# Patient Record
Sex: Female | Born: 1950 | Race: Black or African American | Hispanic: No | Smoking: Never smoker
Health system: Southern US, Community
[De-identification: ages and names within clinical notes are randomized; demographics above are authoritative.]

## PROBLEM LIST (undated history)

## (undated) DIAGNOSIS — F32A Depression, unspecified: Secondary | ICD-10-CM

## (undated) DIAGNOSIS — E119 Type 2 diabetes mellitus without complications: Secondary | ICD-10-CM

## (undated) DIAGNOSIS — F329 Major depressive disorder, single episode, unspecified: Secondary | ICD-10-CM

## (undated) DIAGNOSIS — G4730 Sleep apnea, unspecified: Secondary | ICD-10-CM

## (undated) DIAGNOSIS — K219 Gastro-esophageal reflux disease without esophagitis: Secondary | ICD-10-CM

## (undated) DIAGNOSIS — M549 Dorsalgia, unspecified: Secondary | ICD-10-CM

## (undated) DIAGNOSIS — Z8639 Personal history of other endocrine, nutritional and metabolic disease: Secondary | ICD-10-CM

## (undated) DIAGNOSIS — I10 Essential (primary) hypertension: Secondary | ICD-10-CM

## (undated) DIAGNOSIS — M542 Cervicalgia: Secondary | ICD-10-CM

## (undated) DIAGNOSIS — M1990 Unspecified osteoarthritis, unspecified site: Secondary | ICD-10-CM

## (undated) HISTORY — PX: HERNIA REPAIR: SHX51

## (undated) HISTORY — PX: BREAST CYST ASPIRATION: SHX578

## (undated) HISTORY — PX: TONSILLECTOMY: SUR1361

## (undated) HISTORY — PX: ABDOMINAL HYSTERECTOMY: SHX81

## (undated) HISTORY — PX: CHOLECYSTECTOMY: SHX55

## (undated) HISTORY — PX: LAPAROSCOPIC GASTRIC BANDING: SHX1100

---

## 1996-10-27 HISTORY — PX: ANTERIOR FUSION CERVICAL SPINE: SUR626

## 2004-07-22 ENCOUNTER — Ambulatory Visit (HOSPITAL_BASED_OUTPATIENT_CLINIC_OR_DEPARTMENT_OTHER): Admission: RE | Admit: 2004-07-22 | Discharge: 2004-07-22 | Payer: Self-pay | Admitting: Family Medicine

## 2004-08-05 ENCOUNTER — Encounter: Admission: RE | Admit: 2004-08-05 | Discharge: 2004-08-15 | Payer: Self-pay | Admitting: Family Medicine

## 2004-09-09 ENCOUNTER — Encounter (INDEPENDENT_AMBULATORY_CARE_PROVIDER_SITE_OTHER): Payer: Self-pay | Admitting: *Deleted

## 2004-09-09 ENCOUNTER — Ambulatory Visit (HOSPITAL_COMMUNITY): Admission: RE | Admit: 2004-09-09 | Discharge: 2004-09-09 | Payer: Self-pay | Admitting: Gastroenterology

## 2004-11-21 ENCOUNTER — Encounter: Admission: RE | Admit: 2004-11-21 | Discharge: 2005-02-19 | Payer: Self-pay | Admitting: Family Medicine

## 2004-11-27 ENCOUNTER — Ambulatory Visit: Payer: Self-pay | Admitting: Internal Medicine

## 2005-01-21 ENCOUNTER — Encounter: Admission: RE | Admit: 2005-01-21 | Discharge: 2005-01-21 | Payer: Self-pay | Admitting: Obstetrics and Gynecology

## 2005-05-09 ENCOUNTER — Encounter: Admission: RE | Admit: 2005-05-09 | Discharge: 2005-05-09 | Payer: Self-pay | Admitting: Family Medicine

## 2005-05-09 ENCOUNTER — Emergency Department (HOSPITAL_COMMUNITY): Admission: EM | Admit: 2005-05-09 | Discharge: 2005-05-09 | Payer: Self-pay | Admitting: Emergency Medicine

## 2005-05-19 ENCOUNTER — Encounter: Admission: RE | Admit: 2005-05-19 | Discharge: 2005-05-19 | Payer: Self-pay | Admitting: Family Medicine

## 2005-05-21 ENCOUNTER — Emergency Department (HOSPITAL_COMMUNITY): Admission: EM | Admit: 2005-05-21 | Discharge: 2005-05-21 | Payer: Self-pay | Admitting: Emergency Medicine

## 2005-06-24 ENCOUNTER — Encounter: Admission: RE | Admit: 2005-06-24 | Discharge: 2005-06-24 | Payer: Self-pay | Admitting: Family Medicine

## 2005-07-07 ENCOUNTER — Encounter: Admission: RE | Admit: 2005-07-07 | Discharge: 2005-07-07 | Payer: Self-pay | Admitting: Endocrinology

## 2005-08-06 ENCOUNTER — Ambulatory Visit (HOSPITAL_COMMUNITY): Admission: RE | Admit: 2005-08-06 | Discharge: 2005-08-06 | Payer: Self-pay | Admitting: Neurology

## 2005-09-12 ENCOUNTER — Ambulatory Visit (HOSPITAL_COMMUNITY): Admission: RE | Admit: 2005-09-12 | Discharge: 2005-09-12 | Payer: Self-pay | Admitting: Pulmonary Disease

## 2005-09-12 ENCOUNTER — Encounter (INDEPENDENT_AMBULATORY_CARE_PROVIDER_SITE_OTHER): Payer: Self-pay | Admitting: Specialist

## 2005-10-14 ENCOUNTER — Encounter: Admission: RE | Admit: 2005-10-14 | Discharge: 2005-10-14 | Payer: Self-pay | Admitting: Family Medicine

## 2005-10-22 ENCOUNTER — Ambulatory Visit (HOSPITAL_COMMUNITY): Admission: RE | Admit: 2005-10-22 | Discharge: 2005-10-22 | Payer: Self-pay | Admitting: Neurology

## 2006-01-08 ENCOUNTER — Encounter: Admission: RE | Admit: 2006-01-08 | Discharge: 2006-01-08 | Payer: Self-pay | Admitting: Neurological Surgery

## 2006-01-22 ENCOUNTER — Ambulatory Visit (HOSPITAL_COMMUNITY): Admission: RE | Admit: 2006-01-22 | Discharge: 2006-01-22 | Payer: Self-pay | Admitting: Obstetrics and Gynecology

## 2006-01-28 ENCOUNTER — Encounter: Admission: RE | Admit: 2006-01-28 | Discharge: 2006-04-15 | Payer: Self-pay | Admitting: Surgery

## 2006-01-30 ENCOUNTER — Encounter: Admission: RE | Admit: 2006-01-30 | Discharge: 2006-01-30 | Payer: Self-pay | Admitting: Family Medicine

## 2006-02-11 ENCOUNTER — Encounter: Admission: RE | Admit: 2006-02-11 | Discharge: 2006-02-11 | Payer: Self-pay | Admitting: Family Medicine

## 2006-02-13 ENCOUNTER — Ambulatory Visit (HOSPITAL_COMMUNITY): Admission: RE | Admit: 2006-02-13 | Discharge: 2006-02-13 | Payer: Self-pay | Admitting: Surgery

## 2006-04-27 ENCOUNTER — Encounter: Admission: RE | Admit: 2006-04-27 | Discharge: 2006-04-27 | Payer: Self-pay | Admitting: Neurological Surgery

## 2006-05-15 ENCOUNTER — Encounter: Admission: RE | Admit: 2006-05-15 | Discharge: 2006-07-15 | Payer: Self-pay | Admitting: Surgery

## 2006-07-30 ENCOUNTER — Encounter: Admission: RE | Admit: 2006-07-30 | Discharge: 2006-07-30 | Payer: Self-pay | Admitting: Family Medicine

## 2006-08-03 ENCOUNTER — Encounter: Admission: RE | Admit: 2006-08-03 | Discharge: 2006-11-01 | Payer: Self-pay | Admitting: Surgery

## 2006-08-17 ENCOUNTER — Ambulatory Visit (HOSPITAL_COMMUNITY): Admission: RE | Admit: 2006-08-17 | Discharge: 2006-08-18 | Payer: Self-pay | Admitting: Surgery

## 2006-10-27 DIAGNOSIS — G4730 Sleep apnea, unspecified: Secondary | ICD-10-CM

## 2006-10-27 HISTORY — DX: Sleep apnea, unspecified: G47.30

## 2006-11-30 ENCOUNTER — Encounter: Admission: RE | Admit: 2006-11-30 | Discharge: 2006-11-30 | Payer: Self-pay | Admitting: Neurological Surgery

## 2006-12-01 ENCOUNTER — Encounter: Admission: RE | Admit: 2006-12-01 | Discharge: 2007-03-01 | Payer: Self-pay | Admitting: Surgery

## 2007-02-02 ENCOUNTER — Encounter: Admission: RE | Admit: 2007-02-02 | Discharge: 2007-02-02 | Payer: Self-pay | Admitting: Family Medicine

## 2007-03-04 ENCOUNTER — Encounter: Admission: RE | Admit: 2007-03-04 | Discharge: 2007-06-02 | Payer: Self-pay | Admitting: Surgery

## 2007-06-03 ENCOUNTER — Encounter: Admission: RE | Admit: 2007-06-03 | Discharge: 2007-07-27 | Payer: Self-pay | Admitting: Surgery

## 2007-07-28 ENCOUNTER — Encounter: Admission: RE | Admit: 2007-07-28 | Discharge: 2007-07-28 | Payer: Self-pay | Admitting: Family Medicine

## 2007-08-19 ENCOUNTER — Encounter: Admission: RE | Admit: 2007-08-19 | Discharge: 2007-08-19 | Payer: Self-pay | Admitting: Neurological Surgery

## 2007-08-24 ENCOUNTER — Encounter: Admission: RE | Admit: 2007-08-24 | Discharge: 2007-08-24 | Payer: Self-pay | Admitting: Surgery

## 2007-09-01 ENCOUNTER — Inpatient Hospital Stay (HOSPITAL_COMMUNITY): Admission: EM | Admit: 2007-09-01 | Discharge: 2007-09-03 | Payer: Self-pay | Admitting: Emergency Medicine

## 2007-10-15 ENCOUNTER — Ambulatory Visit (HOSPITAL_COMMUNITY): Admission: RE | Admit: 2007-10-15 | Discharge: 2007-10-15 | Payer: Self-pay | Admitting: General Surgery

## 2007-10-26 ENCOUNTER — Ambulatory Visit (HOSPITAL_BASED_OUTPATIENT_CLINIC_OR_DEPARTMENT_OTHER): Admission: RE | Admit: 2007-10-26 | Discharge: 2007-10-26 | Payer: Self-pay | Admitting: Cardiology

## 2007-10-29 ENCOUNTER — Emergency Department (HOSPITAL_COMMUNITY): Admission: EM | Admit: 2007-10-29 | Discharge: 2007-10-29 | Payer: Self-pay | Admitting: Emergency Medicine

## 2007-10-30 ENCOUNTER — Ambulatory Visit: Payer: Self-pay | Admitting: Internal Medicine

## 2008-01-31 ENCOUNTER — Emergency Department (HOSPITAL_COMMUNITY): Admission: EM | Admit: 2008-01-31 | Discharge: 2008-01-31 | Payer: Self-pay | Admitting: Emergency Medicine

## 2008-02-03 ENCOUNTER — Encounter: Admission: RE | Admit: 2008-02-03 | Discharge: 2008-02-03 | Payer: Self-pay | Admitting: Family Medicine

## 2008-07-26 ENCOUNTER — Emergency Department (HOSPITAL_BASED_OUTPATIENT_CLINIC_OR_DEPARTMENT_OTHER): Admission: EM | Admit: 2008-07-26 | Discharge: 2008-07-26 | Payer: Self-pay | Admitting: Emergency Medicine

## 2008-08-09 ENCOUNTER — Ambulatory Visit (HOSPITAL_COMMUNITY): Admission: RE | Admit: 2008-08-09 | Discharge: 2008-08-09 | Payer: Self-pay | Admitting: Family Medicine

## 2008-09-13 ENCOUNTER — Ambulatory Visit: Payer: Self-pay | Admitting: Sports Medicine

## 2008-09-13 DIAGNOSIS — M84376A Stress fracture, unspecified foot, initial encounter for fracture: Secondary | ICD-10-CM | POA: Insufficient documentation

## 2008-10-05 ENCOUNTER — Ambulatory Visit: Payer: Self-pay | Admitting: Sports Medicine

## 2008-10-05 DIAGNOSIS — M7750 Other enthesopathy of unspecified foot: Secondary | ICD-10-CM | POA: Insufficient documentation

## 2008-11-03 ENCOUNTER — Ambulatory Visit (HOSPITAL_COMMUNITY): Admission: RE | Admit: 2008-11-03 | Discharge: 2008-11-03 | Payer: Self-pay | Admitting: General Surgery

## 2008-11-05 ENCOUNTER — Ambulatory Visit (HOSPITAL_COMMUNITY): Admission: RE | Admit: 2008-11-05 | Discharge: 2008-11-05 | Payer: Self-pay | Admitting: Neurological Surgery

## 2008-11-15 ENCOUNTER — Ambulatory Visit: Payer: Self-pay | Admitting: Sports Medicine

## 2009-02-05 ENCOUNTER — Encounter: Admission: RE | Admit: 2009-02-05 | Discharge: 2009-02-05 | Payer: Self-pay | Admitting: Obstetrics and Gynecology

## 2009-05-23 ENCOUNTER — Ambulatory Visit (HOSPITAL_COMMUNITY): Admission: RE | Admit: 2009-05-23 | Discharge: 2009-05-23 | Payer: Self-pay | Admitting: Neurological Surgery

## 2009-06-19 ENCOUNTER — Ambulatory Visit: Payer: Self-pay | Admitting: Oncology

## 2009-07-06 ENCOUNTER — Inpatient Hospital Stay (HOSPITAL_COMMUNITY): Admission: EM | Admit: 2009-07-06 | Discharge: 2009-07-09 | Payer: Self-pay | Admitting: Emergency Medicine

## 2009-07-06 ENCOUNTER — Ambulatory Visit: Payer: Self-pay | Admitting: Surgery

## 2009-07-06 ENCOUNTER — Encounter (INDEPENDENT_AMBULATORY_CARE_PROVIDER_SITE_OTHER): Payer: Self-pay | Admitting: Internal Medicine

## 2009-07-18 LAB — CBC WITH DIFFERENTIAL/PLATELET
Basophils Absolute: 0 10*3/uL (ref 0.0–0.1)
EOS%: 0.8 % (ref 0.0–7.0)
HGB: 12.7 g/dL (ref 11.6–15.9)
MCH: 28.4 pg (ref 25.1–34.0)
MCV: 83.4 fL (ref 79.5–101.0)
MONO%: 6.4 % (ref 0.0–14.0)
RBC: 4.46 10*6/uL (ref 3.70–5.45)
RDW: 14.8 % — ABNORMAL HIGH (ref 11.2–14.5)

## 2009-07-18 LAB — APTT: aPTT: 32 seconds (ref 24–37)

## 2009-07-19 LAB — IRON AND TIBC
%SAT: 24 % (ref 20–55)
TIBC: 278 ug/dL (ref 250–470)
UIBC: 212 ug/dL

## 2009-07-19 LAB — COMPREHENSIVE METABOLIC PANEL
AST: 13 U/L (ref 0–37)
Albumin: 3.9 g/dL (ref 3.5–5.2)
Alkaline Phosphatase: 57 U/L (ref 39–117)
BUN: 9 mg/dL (ref 6–23)
Potassium: 4.1 mEq/L (ref 3.5–5.3)
Total Bilirubin: 0.3 mg/dL (ref 0.3–1.2)

## 2009-07-19 LAB — FOLATE RBC: RBC Folate: 576 ng/mL (ref 180–600)

## 2009-07-19 LAB — VITAMIN B12: Vitamin B-12: 682 pg/mL (ref 211–911)

## 2009-10-04 ENCOUNTER — Ambulatory Visit (HOSPITAL_COMMUNITY): Admission: RE | Admit: 2009-10-04 | Discharge: 2009-10-04 | Payer: Self-pay | Admitting: Surgery

## 2009-10-18 ENCOUNTER — Ambulatory Visit (HOSPITAL_BASED_OUTPATIENT_CLINIC_OR_DEPARTMENT_OTHER): Admission: RE | Admit: 2009-10-18 | Discharge: 2009-10-18 | Payer: Self-pay | Admitting: Urology

## 2010-02-06 ENCOUNTER — Encounter: Admission: RE | Admit: 2010-02-06 | Discharge: 2010-02-06 | Payer: Self-pay | Admitting: Family Medicine

## 2010-03-18 ENCOUNTER — Ambulatory Visit: Payer: Self-pay | Admitting: Surgery

## 2010-11-17 ENCOUNTER — Encounter: Payer: Self-pay | Admitting: Family Medicine

## 2010-11-17 ENCOUNTER — Encounter: Payer: Self-pay | Admitting: Neurological Surgery

## 2011-01-27 LAB — POCT I-STAT 4, (NA,K, GLUC, HGB,HCT): Hemoglobin: 12.6 g/dL (ref 12.0–15.0)

## 2011-01-31 LAB — COMPREHENSIVE METABOLIC PANEL
ALT: 22 U/L (ref 0–35)
ALT: 59 U/L — ABNORMAL HIGH (ref 0–35)
AST: 20 U/L (ref 0–37)
Albumin: 3.8 g/dL (ref 3.5–5.2)
Alkaline Phosphatase: 61 U/L (ref 39–117)
BUN: 2 mg/dL — ABNORMAL LOW (ref 6–23)
CO2: 29 mEq/L (ref 19–32)
CO2: 30 mEq/L (ref 19–32)
Calcium: 8.9 mg/dL (ref 8.4–10.5)
GFR calc Af Amer: >60 mL/min (ref >60)
GFR calc non Af Amer: >60 mL/min (ref >60)
GFR calc non Af Amer: >60 mL/min (ref >60)
Glucose, Bld: 95 mg/dL (ref 70–99)
Potassium: 3.8 mEq/L (ref 3.5–5.1)
Sodium: 136 mEq/L (ref 135–145)
Total Protein: 6.1 g/dL (ref 6.0–8.3)
Total Protein: 6.9 g/dL (ref 6.0–8.3)

## 2011-01-31 LAB — BASIC METABOLIC PANEL
BUN: 3 mg/dL — ABNORMAL LOW (ref 6–23)
CO2: 29 mEq/L (ref 19–32)
Chloride: 108 mEq/L (ref 96–112)
Glucose, Bld: 103 mg/dL — ABNORMAL HIGH (ref 70–99)
Potassium: 3.8 mEq/L (ref 3.5–5.1)

## 2011-01-31 LAB — CBC
HCT: 31.7 % — ABNORMAL LOW (ref 36.0–46.0)
HCT: 32.3 % — ABNORMAL LOW (ref 36.0–46.0)
HCT: 33.5 % — ABNORMAL LOW (ref 36.0–46.0)
Hemoglobin: 10.6 g/dL — ABNORMAL LOW (ref 12.0–15.0)
Hemoglobin: 10.9 g/dL — ABNORMAL LOW (ref 12.0–15.0)
MCHC: 33.6 g/dL (ref 30.0–36.0)
MCV: 84.6 fL (ref 78.0–100.0)
MCV: 85.3 fL (ref 78.0–100.0)
Platelets: 156 10*3/uL (ref 150–400)
RBC: 3.8 MIL/uL — ABNORMAL LOW (ref 3.87–5.11)
RBC: 3.96 MIL/uL (ref 3.87–5.11)
RDW: 14.6 % (ref 11.5–15.5)
RDW: 14.9 % (ref 11.5–15.5)
WBC: 10.4 10*3/uL (ref 4.0–10.5)

## 2011-01-31 LAB — POCT CARDIAC MARKERS
CKMB, poc: 1 ng/mL (ref 1.0–8.0)
CKMB, poc: <1 ng/mL — ABNORMAL LOW (ref 1.0–8.0)
Myoglobin, poc: 77.7 ng/mL (ref 12–200)
Troponin i, poc: <0.05 ng/mL (ref 0.00–0.09)

## 2011-01-31 LAB — DIFFERENTIAL
Basophils Absolute: 0 10*3/uL (ref 0.0–0.1)
Basophils Relative: 0 % (ref 0–1)
Eosinophils Absolute: 0.1 10*3/uL (ref 0.0–0.7)
Eosinophils Absolute: 0.1 10*3/uL (ref 0.0–0.7)
Lymphs Abs: 1.6 10*3/uL (ref 0.7–4.0)
Monocytes Relative: 8 % (ref 3–12)
Monocytes Relative: 9 % (ref 3–12)
Neutro Abs: 5.9 10*3/uL (ref 1.7–7.7)
Neutrophils Relative %: 71 % (ref 43–77)
Neutrophils Relative %: 76 % (ref 43–77)

## 2011-01-31 LAB — GLUCOSE, CAPILLARY
Glucose-Capillary: 120 mg/dL — ABNORMAL HIGH (ref 70–99)
Glucose-Capillary: 137 mg/dL — ABNORMAL HIGH (ref 70–99)
Glucose-Capillary: 84 mg/dL (ref 70–99)

## 2011-01-31 LAB — URINE MICROSCOPIC-ADD ON

## 2011-01-31 LAB — URINE CULTURE: Culture: NO GROWTH

## 2011-01-31 LAB — PROTIME-INR: INR: 1.1 (ref 0.00–1.49)

## 2011-01-31 LAB — URINALYSIS, ROUTINE W REFLEX MICROSCOPIC
Bilirubin Urine: NEGATIVE
Ketones, ur: NEGATIVE mg/dL
pH: 6 (ref 5.0–8.0)

## 2011-02-06 ENCOUNTER — Other Ambulatory Visit: Payer: Self-pay | Admitting: Obstetrics and Gynecology

## 2011-02-06 DIAGNOSIS — Z1239 Encounter for other screening for malignant neoplasm of breast: Secondary | ICD-10-CM

## 2011-02-07 ENCOUNTER — Emergency Department (INDEPENDENT_AMBULATORY_CARE_PROVIDER_SITE_OTHER): Payer: Self-pay

## 2011-02-07 ENCOUNTER — Emergency Department (HOSPITAL_BASED_OUTPATIENT_CLINIC_OR_DEPARTMENT_OTHER)
Admission: EM | Admit: 2011-02-07 | Discharge: 2011-02-07 | Disposition: A | Discharge status: Home / Self Care | Payer: Self-pay | Attending: Emergency Medicine | Admitting: Emergency Medicine

## 2011-02-07 DIAGNOSIS — I10 Essential (primary) hypertension: Secondary | ICD-10-CM | POA: Insufficient documentation

## 2011-02-07 DIAGNOSIS — M25539 Pain in unspecified wrist: Secondary | ICD-10-CM

## 2011-02-07 DIAGNOSIS — M65839 Other synovitis and tenosynovitis, unspecified forearm: Secondary | ICD-10-CM | POA: Insufficient documentation

## 2011-02-07 DIAGNOSIS — E119 Type 2 diabetes mellitus without complications: Secondary | ICD-10-CM | POA: Insufficient documentation

## 2011-02-07 DIAGNOSIS — E7800 Pure hypercholesterolemia, unspecified: Secondary | ICD-10-CM | POA: Insufficient documentation

## 2011-02-07 DIAGNOSIS — K219 Gastro-esophageal reflux disease without esophagitis: Secondary | ICD-10-CM | POA: Insufficient documentation

## 2011-02-24 ENCOUNTER — Ambulatory Visit
Admission: RE | Admit: 2011-02-24 | Discharge: 2011-02-24 | Disposition: A | Discharge status: Home / Self Care | Payer: Self-pay | Source: Ambulatory Visit | Attending: Obstetrics and Gynecology | Admitting: Obstetrics and Gynecology

## 2011-02-24 DIAGNOSIS — Z1239 Encounter for other screening for malignant neoplasm of breast: Secondary | ICD-10-CM

## 2011-03-11 NOTE — Procedures (Signed)
DUPLEX DEEP VENOUS EXAM - LOWER EXTREMITY   INDICATION:  Left lower extremity swelling, rule out bilateral deep  venous thromboses/reflux, per a record-a-phone message from Dr. Myra Gianotti  on 03/11/2010.   HISTORY:  Edema:  Two months of left lower extremity swelling.  Trauma/Surgery:  No.  Pain:  No.  PE:  No.  Previous DVT:  No.  Anticoagulants:  Other:   DUPLEX EXAM:                CFV   SFV   PopV  PTV    GSV                R  L  R  L  R  L  R   L  R  L  Thrombosis    o  o  o  o  o  o  o   o  o  o  Spontaneous   +  +  +  +  +  +  +   +  +  +  Phasic        +  +  +  +  +  +  +   +  +  +  Augmentation  +  +  +  +  +  +  +   +  +  +  Compressible  +  +  +  +  +  +  +   +  +  +  Competent     +  +  +  +  +  +  +   +  +  +   Legend:  + - yes  o - no  p - partial  D - decreased   IMPRESSION:  No evidence of deep venous thrombosis or venous  insufficiency noted in the bilateral lower extremities.    _____________________________  V. Charlena Cross, MD   CH/MEDQ  D:  03/18/2010  T:  03/18/2010  Job:  [id number]

## 2011-03-11 NOTE — Assessment & Plan Note (Signed)
OFFICE VISIT   Silva, Natalie A  DOB:  1951/04/04                                       03/18/2010  NWGNF#:[id number]   REASON FOR VISIT:  Leg swelling.   HISTORY:  This is a 60 year old female who I am seeing at the request of  Dr. Darrelyn Hillock for swelling in her left leg and left foot that has been  going on for approximately 2 months.  There is no associated trauma.  There is no associated pain.  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   The patient suffers from hypertension and diabetes, which are both  medically managed.   REVIEW OF SYSTEMS:  GENERAL:  Positive for weight gain.  CARDIAC:  Positive for shortness of breath with exertion.  PULMONARY:  Negative.  GI:  Positive for reflux and hiatal hernia.  GU:  Negative.  VASCULAR:  Positive for pain in legs on walking and lying flat.  NEUROLOGIC:  Negative.  MUSCULOSKELETAL:  Positive for joint pain.  All other review of systems are negative as documented in the encounter  form.   PAST SURGICAL HISTORY:  Cervical disk repair (C5-C6), cholecystectomy  and hiatal hernia repair, hysterectomy, tonsillectomy.   FAMILY HISTORY:  Negative for cardiovascular disease at an early age.   SOCIAL HISTORY:  She has 3 children.  She is an Best boy.  Does not smoke, does not drink.   ALLERGIES:  Contrast dye.   PHYSICAL EXAMINATION:  Heart rate 75, blood pressure 155/88.  O2  saturation 99%.  General:  She is well-appearing in no distress.  HEENT:  Within normal limits.  Respirations are nonlabored.  Abdomen:  Obese.  Musculoskeletal:  She has left leg nonpitting edema from the ankle to  the knee.  There is a superficial hemangioma around the dorsum of her  foot wrapping around her ankle which she says has gotten bigger over  time and has been present since birth.   DIAGNOSTIC STUDIES:  I have independently reviewed her ultrasound.  There is no evidence of deep or  superficial venous  thrombus and no  evidence of  venous insufficiency.   ASSESSMENT/PLAN:  Left leg swelling,  In the absence of venous pathology  this most likely represents lymphedema.  The patient denies any history  of trauma.  I do not feel that this hemangioma is contributing to her  swelling.  However, the patient states that it has gotten bigger over  time and so  I think that it certainly is possible.  In any event I  think that the first course of action should be to treat her as if she  is a primary lymphedema.  I have put her in 30-40 mm compression  stockings, knee high.  I have also made a referral to her to see the  lymphedema therapist.  We discussed possibly getting an MRA to evaluate  the arteriovenous network.  I do not think she is a good candidate to  having anything done and therefore we have elected to defer the MRI and  see how she does with treatment of her lymphedema.  I will see her back  to 3 months.     Jorge Ny, MD  Electronically Signed   VWB/MEDQ  D:  03/18/2010  T:  03/19/2010  Job:  [id number]  cc:   Georges Lynch. Darrelyn Hillock, M.D.

## 2011-03-11 NOTE — H&P (Signed)
NAMESHENIYA, GARCIAPEREZ            ACCOUNT NO.:  [id number]   MEDICAL RECORD NO.:  [id number]          PATIENT TYPE:  EMS   LOCATION:  MAJO                         FACILITY:  [facility]   PHYSICIAN:  Armanda Magic, M.D.     DATE OF BIRTH:  08-12-1951   DATE OF ADMISSION:  09/01/2007  DATE OF DISCHARGE:                              HISTORY & PHYSICAL   CHIEF COMPLAINT:  Chest pain.   Natalie Silva is a pleasant 60 year old female with no known history of  coronary artery disease.  She awoke around 5:00 a.m. with substernal  chest pressure radiating into her left neck and shoulder.  This was  associated with nausea.  On occasion, it was worse with deep inspiration  but not always.  She presents to the emergency room but is currently  pain free.   CARDIAC RISK FACTORS:  She has multiple cardiac risk factors including  age, hypertension, diabetes mellitus, obesity, hyperlipidemia.   PAST MEDICAL HISTORY:  Also includes Laband surgery last year.   SOCIAL HISTORY:  No tobacco  or illicit drug use.  She has an occasional  alcoholic drink.   FAMILY HISTORY:  Mom alive with a history of back pain and hypertension.  Dad alive with a history of hypertension.   ALLERGIES:  1. IV DYE.  2. IODINE.   MEDICATIONS:  Prilosec 40 mg a day, Diovan/HCTZ daily, baby aspirin  daily, Celexa 20 mg a day, Vesicare 20 mg a day.   PHYSICAL EXAMINATION:  VITAL SIGNS:  Temperature 98.1, pulse 72,  respirations 21, blood pressure 157/85.  HEENT: Grossly normal.  No  carotid or subclavian bruits, no JVD or thyromegaly.  Sclerae clear,  conjunctivae normal, nares without drainage.  CHEST: Clear to auscultation bilaterally.  No wheezing or rhonchi.  HEART: Regular rate and rhythm.  No gross murmur.  ABDOMEN:  Benign.  Good bowel sounds, nontender, nondistended.  No  masses, no bruits.  Obese.  EXTREMITIES: No peripheral edema.  SKIN: Warm and dry.  NEURO:  Cranial nerves II-XII grossly intact.  Normal mood  and affect.   LABORATORY DATA:  Initial lab studies show a BUN of 16, creatinine 0.8.  Sodium 137, potassium 3.61.  Point of care markers negative times one.  EKG shows normal sinus rhythm, rate 67 with nonspecific ST-T wave  changes diffusely, nothing acute.   ASSESSMENT/PLAN:  1. Unstable angina.  2. Hypertension.  3. Hyperlipidemia  4. Diabetes mellitus.  5. Obesity.   The patient will be admitted.  Enzymes cycled and a TSH and D-dimer will  also be drawn.  If cardiac enzymes are negative, will perform a  Cardiolite study in the morning.  Otherwise if positive, we will need to  go ahead and plan for cardiac catheterization and pretreat her secondary  to her IV dye allergy.   The patient was seen and examined by Dr. Armanda Magic.      Guy Franco, P.A.      Armanda Magic, M.D.  Electronically Signed    LB/MEDQ  D:  09/01/2007  T:  09/02/2007  Job:  [id number]   cc:   Clarene Critchley  Carleene Overlie, M.D.

## 2011-03-11 NOTE — Procedures (Signed)
NAMEVINCIE, Natalie Silva            ACCOUNT NO.:  [id number]   MEDICAL RECORD NO.:  [id number]          PATIENT TYPE:  OUT   LOCATION:  SLEEP CENTER                 FACILITY:  [facility]   PHYSICIAN:  Clinton D. Maple Hudson, MD, FCCP, FACPDATE OF BIRTH:  05[birth year]50   DATE OF STUDY:  10/26/2007                            NOCTURNAL POLYSOMNOGRAM   REFERRING PHYSICIAN:  Armanda Magic, M.D.   INDICATION FOR STUDY:  Hypersomnia with sleep apnea.   EPWORTH SLEEPINESS SCORE:  7/24.   BMI 39, weight 260 pounds, height 68.5 inches, neck 15.5 inches.   HOME MEDICATIONS:  Charted and reviewed.   A previous study on August 06, 2004, recorded an AHI of 11 per hour and  was associated with split protocol CPAP titration to 10 CWP for an AHI  of 0 per hour.  She has not been wearing CPAP regularly.   SLEEP ARCHITECTURE:  Split study protocol.  During the diagnostic phase,  total sleep time was 134 minutes with sleep efficiency 72%.  Stage 1 was  29%, stage 2 was 71%.  Stages 3 and REM were absent.  Awake after sleep  onset 40 minutes.  Arousal index 41 per hour, indicating marked sleep  fragmentation.  No bedtime medication was taken.  CPAP was then titrated  to 16 CWP, AHI 3.6 per hour.  A medium ResMed Quattro full-face mask was  used with heated humidifier.   RESPIRATORY DATA:   OXYGEN DATA:  Moderate snoring before CPAP with oxygen desaturation to a  nadir of 87% during the diagnostic phase.  With CPAP control, saturation  held 94.5% on room air and snoring was prevented.   CARDIAC DATA:  Normal sinus rhythm.   MOVEMENT-PARASOMNIA:  No significant movement disturbance.  Bathroom x2.   IMPRESSION/RECOMMENDATION:  1. Severe obstructive sleep apnea/hypopnea syndrome, AHI 61.1 per hour      with all events recorded while sleeping laterally.  Moderate      snoring with oxygen desaturation to a nadir of 87%.  2. Successful CPAP titration to 16 CWP, AHI 3.6 per hour.  A medium      ResMed full-face  Quattro mask was used with heated humidifier.  3. Comparison baseline studies from August 06, 2004, had recorded an      AHI of 11 per hour with CPAP titration then to 10 CWP for an AHI of      0 per hour.      Clinton D. Maple Hudson, MD, [facility], FACP  Diplomate, Biomedical engineer of Sleep Medicine  Electronically Signed     CDY/MEDQ  D:  10/30/2007 11:15:33  T:  10/30/2007 11:57:47  Job:  [id number]

## 2011-03-11 NOTE — Discharge Summary (Signed)
Natalie Silva, Natalie Silva            ACCOUNT NO.:  [id number]   MEDICAL RECORD NO.:  [id number]          PATIENT TYPE:  INP   LOCATION:  3709                         FACILITY:  [facility]   PHYSICIAN:  Armanda Magic, M.D.     DATE OF BIRTH:  07-Sep-1951   DATE OF ADMISSION:  09/01/2007  DATE OF DISCHARGE:  09/03/2007                               DISCHARGE SUMMARY   DISCHARGE DIAGNOSES:  1. Chest pain, resolved.  2. Hypertension.  3. Hyperlipidemia.  4. Diabetes mellitus.  5. Long term medication use.   HISTORY OF PRESENT ILLNESS:  Natalie Silva is a 60 year old female with  no known history of coronary artery disease who awoke around 5 o'clock  a.m. on the date of admission with chest pressure, neck pain and nausea.  At times, she thinks the pain was worse with deep inspiration, although  this was not a constant factor and she proceeded to the emergency room.  She pain free in the emergency room; however, we felt that with her  symptoms and cardiac risk factors, she needed to be admitted.   During her hospitalization, cardiac isoenzymes were negative.  Total  cholesterol was 141, LDL of 76, HDL of 50, triglycerides 77.  TSH 0.676.  D-dimer 0.26.  Hemoglobin 12.7, hematocrit 37.6, platelets 248, white  count 7.1, BUN 16, creatinine 0.8, sodium 137 and potassium 3.6.   Chest x-ray showed no acute.   EKG had normal sinus rhythm with nonspecific ST/T wave changes  diffusely.   As part of the patient's workup, we did perform a stress Cardiolite.  The Cardiolite showed a fixed apical defect consistent with breast  attenuation.  EF was 63%.  There was no obvious ischemia.  She was then  discharged to home in stable, but improved condition.   DISCHARGE INSTRUCTIONS:  Remain on a low-sodium, heart-healthy diet.  Increase activity slowly.  Followup to see Dr. Mayford Knife on September 17, 2007 at 2:30 p.m.   DISCHARGE MEDICATIONS:  1. Lipitor 20 mg a day.  2. Diovan/HCTZ 160/12.5 mg daily.  3. Vesicare 10 mg a day.  4. Celexa 40 mg daily.  5. Baby aspirin 81 mg a day.  6. Nexium 40 mg a day.  7. Sublingual nitroglycerin p.r.n. chest pain.   She is to followup to make sure her symptoms are resolving.      Guy Franco, P.A.      Armanda Magic, M.D.  Electronically Signed    LB/MEDQ  D:  09/03/2007  T:  09/03/2007  Job:  [id number]

## 2011-03-14 NOTE — Op Note (Signed)
Natalie Silva, Natalie Silva            ACCOUNT NO.:  [id number]   MEDICAL RECORD NO.:  [id number]          PATIENT TYPE:  AMB   LOCATION:  ENDO                         FACILITY:  [facility]   PHYSICIAN:  James L. Malon Kindle., M.D.DATE OF BIRTH:  [birth year]   DATE OF PROCEDURE:  09/12/2005  DATE OF DISCHARGE:                                 OPERATIVE REPORT   PROCEDURE:  Colonoscopy and biopsy.   MEDICATIONS GIVEN:  Fentanyl 90 mcg, Versed 9 mg IV.   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  Due to persistent diarrhea, her colonoscopy and biopsies are  repeated.   DESCRIPTION OF PROCEDURE:  The procedure was explained and patient consent  obtained.  With the patient in the left lateral decubitus position, the  Olympus scope was inserted and advanced.  The prep was excellent.  We were  able to reach the cecum without difficulty.  The ileocecal valve and  appendiceal orifice were seen.  The scope was withdrawn.  The cecum,  ascending colon, transverse colon, descending and sigmoid colon were seen  well.  No polyps were seen.  Multiple random biopsies were taken throughout  the colon and sent for pathological evaluation.  The rectum was free of  polyps.  The scope was withdrawn and the patient tolerated the procedure  well.   ASSESSMENT:  Diarrhea of unclear cause, 787.91.   PLAN:  Will check pathology and see the patient back in the office in 6-8  weeks.           ______________________________  Llana Aliment Malon Kindle., M.D.     Waldron Session  D:  09/12/2005  T:  09/12/2005  Job:  [id number]   cc:   Lavonda Jumbo, M.D.  Fax: [phone]

## 2011-03-14 NOTE — Consult Note (Signed)
NAMEALVIS, PULCINI            ACCOUNT NO.:  [id number]   MEDICAL RECORD NO.:  [id number]          PATIENT TYPE:  EMS   LOCATION:  MAJO                         FACILITY:  [facility]   PHYSICIAN:  Stefani Dama, M.D.  DATE OF BIRTH:  [birth year]   DATE OF CONSULTATION:  05/09/2005  DATE OF DISCHARGE:                                   CONSULTATION   REASON FOR CONSULTATION:  Herniated nucleus pulposus L3-L4 left with left  lumbar radiculopathy.   HISTORY OF PRESENT ILLNESS:  Ms. Lynelle Doctor is a 60 year old right handed black  lady who has had a three week history of back pain with onset of left lower  extremity pain which radiated throughout the left lower extremity.  She  notes the pain has gotten progressively worse over the past several weeks.  Today, she had an MRI performed at [facility] and this study  demonstrated there is multi-level mild disc degeneration with small  herniated nucleus pulposus at L3-L4 on the left side.  There is no evidence  of any root impingement.  The patient is seen in the emergency room  because  of the severity of the pain.  Dr. Lynelle Doctor requested my evaluation of this  individual in regards to the findings on the MRI.  She has been given a dose  of Decadron 10 mg IV.   PAST MEDICAL HISTORY:  The patient has a history of diabetes mellitus.  She  is on Metformin ER 1000 mg b.i.d.  Her primary care physician is Dr. Joselyn Arrow.  She also has hypertension.   MEDICATIONS:  She is taking Diovan 80 mg b.i.d., hydrochlorothiazide 12.5 mg  daily, Celexa 40 mg daily.   ALLERGIES:  No known drug allergies.   REVIEW OF SYSTEMS:  The patient's worse position is that of sitting.  Standing and walking are also quite severely irritating.  She does get some  relief with lying down.  Bowel and bladder control has been intact.  She  notes numbness and tingling in the toes in the left lower extremity.  Other  systems review in regards to her other systems including  respiratory,  endocrine, GI, are within normal limits.   PHYSICAL EXAMINATION:  She is an alert and oriented individual in no obvious  distress.  She rolls over in bed and sits up in the bed with some  difficulty, however, on examination she has very minimal palpable tenderness  in the lower lumbar spine.  Motor strength is good in the iliopsoas,  quadriceps, tibialis anterior, and the gastroc.  Deep tendon reflexes are 2+  in both patellae, 2+ in both Achilles, Babinski are downgoing.  Sensation is  intact in the distal lower extremities.  Straight leg raising is negative to  45 degrees on the right side.   IMPRESSION:  The patient has evidence of small herniated nucleus pulposus at  L3-L4 on the left hand side without any obvious compression of the nerve  roots or significant compromise of the thecal sac.  I believe that this  process should be treated conservatively.  I did note that the patient does  have the liability of diabetes which tends to make these processes more  difficult to treat and more long-lasting.  She has had the IV steroid dosage  and I believe that this is a substantial dose given her history of diabetes.  Hopefully, this will help the nerve to calm down and she can be given a  prescription for Percocet as needed for pain.  If not, then she may respond  best to an epidural steroid  injection in the lumbar spine performed by the physicians at [facility].  I would  not plan this until the next week.  In any event, I do not feel that  surgical intervention for the disc herniation the way it is at present is  something that should be considered for her care.       HJE/MEDQ  D:  05/09/2005  T:  05/09/2005  Job:  [id number]

## 2011-03-14 NOTE — Op Note (Signed)
NAMELASHAE, WOLLENBERG            ACCOUNT NO.:  [id number]   MEDICAL RECORD NO.:  [id number]          PATIENT TYPE:  AMB   LOCATION:  ENDO                         FACILITY:  [facility]   PHYSICIAN:  James L. Malon Kindle., M.D.DATE OF BIRTH:  1951-03-14   DATE OF PROCEDURE:  09/09/2004  DATE OF DISCHARGE:                                 OPERATIVE REPORT   PROCEDURE:  Esophagogastroduodenoscopy and biopsy.   ENDOSCOPIST:  Llana Aliment. Randa Evens, M.D.   MEDICATIONS:  Cetacaine spray, fentanyl 85 mcg, Versed 8.5 mg intravenously.   INDICATIONS FOR PROCEDURE:  Severe esophageal reflux with a history of  gastric polyps in another location.  This was done in followup.   DESCRIPTION OF PROCEDURE:  The procedure has been explained to the patient  and consent was obtained.  The patient was placed in left lateral decubitus  position.  The Olympus scope was inserted in advance.  The stomach was  entered, identified and passed.  The duodenum including the bulb and second  portion was seen well.  The scope was withdrawn back into the stomach.  Pyloric channel was normal.  The antrum and body were seen well and were  normal.  The fundus and cardia were examined in the retroflexed view.  In  the cardia of the stomach were several small 2-3 mm polyps that were  biopsied.  There were no large polyps.  The GE junction was widely patent  with free reflux seen.  The distal and proximal esophagus were  endoscopically normal.  The scope was withdrawn and the patient tolerated  the procedure well.   ASSESSMENT:  1.  Gastric polyps (211.1).  2.  Gastroesophageal reflux disease (530.81).   PLAN:  Will recommend a reflux sheet with same medications, and will see  back in the office in 6 weeks.       JLE/MEDQ  D:  09/09/2004  T:  09/09/2004  Job:  [id number]   cc:   Lavonda Jumbo, M.D.  [address] [place], [place] [address]  Fax: [phone]

## 2011-03-14 NOTE — Procedures (Signed)
Natalie Silva, Natalie Silva            ACCOUNT NO.:  [id number]   MEDICAL RECORD NO.:  [id number]          PATIENT TYPE:  OUT   LOCATION:  SLEEP CENTER                 [facility]ACILITY[facility]   PHYSICIAN:  Clinton D. Maple Hudson, M.D. DATE OF BIRTH:  1951/03/08   DATE OF STUDY:  08/06/2004                              NOCTURNAL POLYSOMNOGRAM   STUDY DATE:  August 06, 2004   REFERRING PHYSICIAN:  Lavonda Jumbo, M.D.   INDICATION FOR THE STUDY:  Hypersomnia with sleep apnea.   NECK SIZE:  17 inches   EPWORTH SLEEPINESS SCORE:  8/24   BODY MASS INDEX:  39.9   WEIGHT:  270 pounds   MEDICATIONS INCLUDE:  Trazodone, Celexa, and Ambien which may affect sleep.   SLEEP ARCHITECTURE:  Total sleep time 437 minutes with sleep efficiency 96%,  stage I was 8%, stage II was 80%, stages III and IV were absent, REM was 12%  of total sleep time.  Sleep latency was 2 minutes, REM latency 204 minutes,  awake after sleep onset 17 minutes, arousal index 13.   RESPIRATORY DATA:  Split-study protocol.  RDI 11/hr indicating mild  obstructive sleep apnea/hypopnea syndrome before CPAP titration.  This  included 5 obstructive apneas and 29 hypopneas before CPAP.  She slept only  supine.  REM RDI was 5.8/hr.  CPAP was titrated to 10 CWP, RDI 0/hr using a  Respironics ComfortGel Nasal Mask with heated humidifier and chin strap.   OXYGEN DATA:  Mild to moderate snoring with oxygen desaturation to a nadir  of 83% before CPAP titration.  After CPAP titration saturation held 94-95%  on room air.   CARDIAC DATA:  Normal sinus rhythm with occasional PVC.   MOVEMENT/PARASOMNIA:  A total of 86 limb jerks were recorded of which 8 were  associated with arousal or awakening for a periodic limb movement with  arousal index of 1.1/hr which was of doubtful significance.   IMPRESSION/RECOMMENDATION:  Mild obstructive sleep apnea/hypopnea syndrome,  respiratory disturbance index 11/hr with desaturation to 83%.  Continuous  positive airway pressure titrated to 10 CWP using a Respironics ComfortGel  Nasal Mask with heated humidifier and chin strap.      CDY/MEDQ  D:  08/11/2004 10:20:51  T:  08/11/2004 19:45:39  Job:  [id number]

## 2011-03-14 NOTE — Op Note (Signed)
NAMERASHAWN, ROLON            ACCOUNT NO.:  [id number]   MEDICAL RECORD NO.:  [id number]          PATIENT TYPE:  OIB   LOCATION:  0156                         FACILITY:  [facility]   PHYSICIAN:  Sandria Bales. Ezzard Standing, M.D.  DATE OF BIRTH:  1951/09/13   DATE OF PROCEDURE:  08/17/2006  DATE OF DISCHARGE:                                 OPERATIVE REPORT   PREOPERATIVE DIAGNOSIS:  Morbid obesity, weight 287, BMI 42.2.   POSTOPERATIVE DIAGNOSIS:  Morbid obesity, weight 287, BMI 42.2, extensive  upper abdominal adhesions.   OPERATION PERFORMED:  Enterolysis of adhesions (90 plus minutes during the  dissection).  Lap adjustable gastric band (APS band) and upper endoscopy.   SURGEON:  Sandria Bales. Ezzard Standing, M.D.   ASSISTANT:  Sharlet Salina T. Hoxworth, M.D.   ANESTHESIA:  General endotracheal.   ESTIMATED BLOOD LOSS:  Minimal.   INDICATIONS FOR PROCEDURE:  Ms. Lucinda Dell is a 60 year old white female who  has had longstanding morbid obesity.  She has been through our preoperative  bariatric program which has included labs, x-rays, psych, nutritional  consults.  Most significant, Ms. McLoughlin is she had an open  cholecystectomy and hiatal hernia repair in approximately 1993 in [place].  I got the operative note.  There is no evidence of a wrap.  It looks like  she had a crural repair.  So I think she is a candidate for attempted  laparoscopic adjustable gastric banding.   I discussed with her the indications and potential complications.  The  potential complications include but not limited to bleeding, infection,  bowel injury.  Slippage of the band and erosion of the band.  In her  peculiar case, there certainly is a chance that she has so much adhesions  that I could not dissect out her upper hiatus where I could put a band in.  I might have to decide it would be worth trial or proceeding open or  terminating the case.  I think she understands all these risks of the  procedure.   DESCRIPTION  OF PROCEDURE:  Patient placed in a supine position, given a  general endotracheal anesthetic.  She is given a gram of Ancef initiation of  procedure.  Her abdomen was prepped with Betadine solution and sterilely  draped.   The abdominal insufflation obtained with a 12 mm Ethicon Optiview trocar  through the left upper quadrant.  Upon entering the abdominal cavity, she  was noted to have both adhesions in the upper midline of the abdomen and  lower pelvis.  I left the pelvic adhesions alone.  I placed a 5 mm trocar in  the left lower quadrant to help dissect up and identify some of these  adhesions.  I then placed all my normal trocars which was a 5 mm subxiphoid  trocar for the [facility], a 12 mm right paramedian trocar in the subcostal  location for the 15 mm.  A right paramedian 12 mm trocar, a left paramedian  12 mm trocar for the scope and then a lateral 5 mm trocar.   I spent 90 plus minutes dissecting down adhesions from the  prior surgery  elevating the left lobe of the liver off the anterior wall of the stomach  and dissecting along the hiatus identifying what I thought was the caudate  lobe, the right crus of the diaphragm, the left crus of the diaphragm.  Unfortunately, she did not have an access for a pars flaccida approach for  lap band placement and so I had to go put the band at least in part through  her lesser curvature.  I used the newer adjustable gastric band APS  (standard size).  Because of time taken during the dissection worrying about  her upper stomach, I did go on and endoscope her at this point to make sure  there was no evidence of either air leak or any injuries from an endoscopic  view point.  The scope delineated the esophagogastric junction at about 40  cm.  I was able to retroflex the scope and got a good view of her cardia and  esophagogastric junction and saw no evidence of any transmural injury and at  this time Dr. Johna Sheriff put the upper abdomen under  saline and flooded this  while I had air pressure in the stomach and saw no air leak.   The abdomen was then, I then placed a sizer with which I cinched down the  band which it looked like it fit nicely.  I removed the sizer and did  imbrication of the stomach over the band.  I used three sutures along the  anterior wall which I then closed the stomach over the band.   I then irrigated the upper abdomen, I re-identified the caudate lobe, left  lobe of the liver which had been retracted with a Surveyor, mining.  I  looked at all of the adhesion areas.  I cinched the band down and then sewed  it in place with interrupted 2-0 Ethibond suture.  I used three sutures of  this.   After the band was in place I did take photos of this and there was less  than ideal placement because I could not use a pars flaccida technique  because of the prior adhesions but I thought this was better than doing  nothing and the band, both its placement and location looked very good  postop.   I then removed the trocars in turn along with the [facility] retractor.  There was no bleeding at the trocar site.  The band tubing came out through  the right upper quadrant which I attached to the reservoir.  I sewed the  reservoir in place with four interrupted 2-0 nylon sutures.  I did not close  the subcutaneous tissues but irrigated each wound and closed it with the 5-0  Monocryl suture.  I painted the wounds with tincture of benzoin and Steri-  stripped them.   The patient tolerated the procedure well, was transported to the recovery  room in good condition. Sponge and needle counts were correct at the end of  this case.      Sandria Bales. Ezzard Standing, M.D.  Electronically Signed     DHN/MEDQ  D:  08/17/2006  T:  08/18/2006  Job:  [id number]   cc:   Lavonda Jumbo, M.D.  Fax: [phone]   Dorisann Frames, M.D.  Fax: [phone]   Llana Aliment. Malon Kindle., M.D.  Fax: [phone]  Charlaine Dalton. Sherene Sires, MD, FCCP  [address]  [place]  [place] [address]

## 2011-08-05 LAB — D-DIMER, QUANTITATIVE

## 2011-08-05 LAB — CBC
HCT: 39.4
Hemoglobin: 13.2
MCHC: 33.6
MCV: 82.2
Platelets: 248
Platelets: 274
RBC: 4.79
RDW: 15.5 — ABNORMAL HIGH
RDW: 15.6 — ABNORMAL HIGH
WBC: 9.6

## 2011-08-05 LAB — CK TOTAL AND CKMB (NOT AT ARMC)
CK, MB: 1.1
CK, MB: 1.6
Relative Index: 1.6
Relative Index: INVALID
Total CK: 102
Total CK: 72
Total CK: 86

## 2011-08-05 LAB — I-STAT 8, (EC8 V) (CONVERTED LAB)
BUN: 16
Chloride: 104
Glucose, Bld: 113 — ABNORMAL HIGH
HCT: 44
Hemoglobin: 15
Potassium: 3.6
Sodium: 137

## 2011-08-05 LAB — LIPID PANEL
Cholesterol: 141
LDL Cholesterol: 76
Total CHOL/HDL Ratio: 2.8

## 2011-08-05 LAB — POCT CARDIAC MARKERS
CKMB, poc: 1.2
Myoglobin, poc: 54.2
Operator id: 288331
Troponin i, poc: <0.05

## 2011-08-05 LAB — APTT: aPTT: 31

## 2011-08-05 LAB — COMPREHENSIVE METABOLIC PANEL
ALT: 18
Alkaline Phosphatase: 58
BUN: 14
CO2: 31
Calcium: 9.1
GFR calc non Af Amer: >60
Glucose, Bld: 90
Potassium: 3.5
Total Protein: 6.9

## 2011-08-05 LAB — TROPONIN I

## 2011-08-05 LAB — MAGNESIUM: Magnesium: 1.9

## 2011-08-05 LAB — TSH: TSH: 0.676

## 2011-11-19 ENCOUNTER — Other Ambulatory Visit (INDEPENDENT_AMBULATORY_CARE_PROVIDER_SITE_OTHER): Payer: Self-pay | Admitting: General Surgery

## 2011-11-19 DIAGNOSIS — IMO0002 Reserved for concepts with insufficient information to code with codable children: Secondary | ICD-10-CM

## 2011-11-21 ENCOUNTER — Ambulatory Visit
Admission: RE | Admit: 2011-11-21 | Discharge: 2011-11-21 | Disposition: A | Discharge status: Home / Self Care | Payer: BC Managed Care – PPO | Source: Ambulatory Visit | Attending: General Surgery | Admitting: General Surgery

## 2011-11-21 DIAGNOSIS — IMO0002 Reserved for concepts with insufficient information to code with codable children: Secondary | ICD-10-CM

## 2011-12-08 ENCOUNTER — Other Ambulatory Visit: Payer: Self-pay | Admitting: Neurological Surgery

## 2011-12-08 DIAGNOSIS — M5416 Radiculopathy, lumbar region: Secondary | ICD-10-CM

## 2011-12-10 ENCOUNTER — Ambulatory Visit
Admission: RE | Admit: 2011-12-10 | Discharge: 2011-12-10 | Disposition: A | Discharge status: Home / Self Care | Payer: BC Managed Care – PPO | Source: Ambulatory Visit | Attending: Neurological Surgery | Admitting: Neurological Surgery

## 2011-12-10 DIAGNOSIS — M5416 Radiculopathy, lumbar region: Secondary | ICD-10-CM

## 2011-12-12 ENCOUNTER — Other Ambulatory Visit: Payer: Self-pay | Admitting: Neurological Surgery

## 2011-12-12 ENCOUNTER — Encounter (HOSPITAL_COMMUNITY): Payer: Self-pay | Admitting: Pharmacy Technician

## 2011-12-18 NOTE — Pre-Procedure Instructions (Signed)
20 Natalie Silva  12/18/2011   Your procedure is scheduled on:  Feb 26  Report to [facility] at 0830 AM.  Call this number if you have problems the morning of surgery: [phone]   Remember:   Do not eat food:After Midnight.  May have clear liquids: up to 4 Hours before arrival.  Clear liquids include soda, tea, black coffee, apple or grape juice, broth.  Take these medicines the morning of surgery with A SIP OF WATER: celexa   Do not wear jewelry, make-up or nail polish.  Do not wear lotions, powders, or perfumes. You may wear deodorant.  Do not shave 48 hours prior to surgery.  Do not bring valuables to the hospital.  Contacts, dentures or bridgework may not be worn into surgery.  Leave suitcase in the car. After surgery it may be brought to your room.  For patients admitted to the hospital, checkout time is 11:00 AM the day of discharge.   Patients discharged the day of surgery will not be allowed to drive home.  Name and phone number of your driver: family  Special Instructions: CHG Shower Use Special Wash: 1/2 bottle night before surgery and 1/2 bottle morning of surgery.   Please read over the following fact sheets that you were given: Coughing and Deep Breathing, MRSA Information and Surgical Site Infection Prevention  Stop blood thinners herbal medications

## 2011-12-19 ENCOUNTER — Other Ambulatory Visit: Payer: Self-pay

## 2011-12-19 ENCOUNTER — Encounter (HOSPITAL_COMMUNITY)
Admission: RE | Admit: 2011-12-19 | Discharge: 2011-12-19 | Disposition: A | Discharge status: Home / Self Care | Payer: BC Managed Care – PPO | Source: Ambulatory Visit | Attending: Neurological Surgery | Admitting: Neurological Surgery

## 2011-12-19 ENCOUNTER — Ambulatory Visit (HOSPITAL_COMMUNITY)
Admission: RE | Admit: 2011-12-19 | Discharge: 2011-12-19 | Disposition: A | Discharge status: Home / Self Care | Payer: BC Managed Care – PPO | Source: Ambulatory Visit | Attending: Anesthesiology | Admitting: Anesthesiology

## 2011-12-19 ENCOUNTER — Encounter (HOSPITAL_COMMUNITY): Payer: Self-pay

## 2011-12-19 DIAGNOSIS — Z01812 Encounter for preprocedural laboratory examination: Secondary | ICD-10-CM | POA: Insufficient documentation

## 2011-12-19 DIAGNOSIS — Z01818 Encounter for other preprocedural examination: Secondary | ICD-10-CM | POA: Insufficient documentation

## 2011-12-19 DIAGNOSIS — Z01810 Encounter for preprocedural cardiovascular examination: Secondary | ICD-10-CM | POA: Insufficient documentation

## 2011-12-19 HISTORY — DX: Major depressive disorder, single episode, unspecified: F32.9

## 2011-12-19 HISTORY — DX: Dorsalgia, unspecified: M54.9

## 2011-12-19 HISTORY — DX: Essential (primary) hypertension: I10

## 2011-12-19 HISTORY — DX: Sleep apnea, unspecified: G47.30

## 2011-12-19 HISTORY — DX: Personal history of other endocrine, nutritional and metabolic disease: Z86.39

## 2011-12-19 HISTORY — DX: Depression, unspecified: F32.A

## 2011-12-19 HISTORY — DX: Unspecified osteoarthritis, unspecified site: M19.90

## 2011-12-19 HISTORY — DX: Gastro-esophageal reflux disease without esophagitis: K21.9

## 2011-12-19 HISTORY — DX: Cervicalgia: M54.2

## 2011-12-19 LAB — BASIC METABOLIC PANEL
CO2: 31 mEq/L (ref 19–32)
Calcium: 9.8 mg/dL (ref 8.4–10.5)
Creatinine, Ser: 0.65 mg/dL (ref 0.50–1.10)
Glucose, Bld: 110 mg/dL — ABNORMAL HIGH (ref 70–99)
Sodium: 143 mEq/L (ref 135–145)

## 2011-12-19 LAB — CBC
HCT: 36.1 % (ref 36.0–46.0)
MCH: 27.5 pg (ref 26.0–34.0)
MCV: 75.8 fL — ABNORMAL LOW (ref 78.0–100.0)
Platelets: 232 10*3/uL (ref 150–400)
RBC: 4.76 MIL/uL (ref 3.87–5.11)

## 2011-12-19 NOTE — Progress Notes (Signed)
Sleep study in [place].unable to  Obtain 2008 study [facility]

## 2011-12-19 NOTE — Progress Notes (Signed)
requesred ov note stress [facility]

## 2011-12-22 MED ORDER — CEFAZOLIN SODIUM-DEXTROSE 2-3 GM-% IV SOLR
2.0000 g | INTRAVENOUS | Status: AC
  Administered 2011-12-23: 2 g via INTRAVENOUS
  Filled 2011-12-22: qty 50

## 2011-12-22 NOTE — Progress Notes (Signed)
Echo, Stress test reports noted. OK to proceed.  Kipp Brood, MD

## 2011-12-23 ENCOUNTER — Ambulatory Visit (HOSPITAL_COMMUNITY): Payer: BC Managed Care – PPO | Admitting: Anesthesiology

## 2011-12-23 ENCOUNTER — Encounter (HOSPITAL_COMMUNITY): Payer: Self-pay | Admitting: Anesthesiology

## 2011-12-23 ENCOUNTER — Encounter (HOSPITAL_COMMUNITY)
Admission: RE | Disposition: A | Discharge status: Home / Self Care | Payer: Self-pay | Source: Ambulatory Visit | Attending: Neurological Surgery

## 2011-12-23 ENCOUNTER — Ambulatory Visit (HOSPITAL_COMMUNITY): Payer: BC Managed Care – PPO

## 2011-12-23 ENCOUNTER — Encounter (HOSPITAL_COMMUNITY): Payer: Self-pay | Admitting: *Deleted

## 2011-12-23 ENCOUNTER — Ambulatory Visit (HOSPITAL_COMMUNITY)
Admission: RE | Admit: 2011-12-23 | Discharge: 2011-12-25 | DRG: 865 | Disposition: A | Discharge status: Home / Self Care | Payer: BC Managed Care – PPO | Source: Ambulatory Visit | Attending: Neurological Surgery | Admitting: Neurological Surgery

## 2011-12-23 DIAGNOSIS — M4712 Other spondylosis with myelopathy, cervical region: Secondary | ICD-10-CM | POA: Insufficient documentation

## 2011-12-23 DIAGNOSIS — G4730 Sleep apnea, unspecified: Secondary | ICD-10-CM | POA: Insufficient documentation

## 2011-12-23 DIAGNOSIS — I10 Essential (primary) hypertension: Secondary | ICD-10-CM | POA: Insufficient documentation

## 2011-12-23 HISTORY — PX: ANTERIOR CERVICAL DECOMP/DISCECTOMY FUSION: SHX1161

## 2011-12-23 LAB — GLUCOSE, CAPILLARY: Glucose-Capillary: 103 mg/dL — ABNORMAL HIGH (ref 70–99)

## 2011-12-23 SURGERY — ANTERIOR CERVICAL DECOMPRESSION/DISCECTOMY FUSION 1 LEVEL
Anesthesia: General | Site: Spine Cervical | Wound class: Clean

## 2011-12-23 MED ORDER — MIDAZOLAM HCL 5 MG/5ML IJ SOLN
INTRAMUSCULAR | Status: DC | PRN
  Administered 2011-12-23: 2 mg via INTRAVENOUS

## 2011-12-23 MED ORDER — HYDROMORPHONE HCL PF 1 MG/ML IJ SOLN
0.2500 mg | INTRAMUSCULAR | Status: DC | PRN
  Administered 2011-12-23: 0.25 mg via INTRAVENOUS
  Administered 2011-12-23 (×2): 0.5 mg via INTRAVENOUS
  Administered 2011-12-23: 0.25 mg via INTRAVENOUS
  Administered 2011-12-23: 0.5 mg via INTRAVENOUS

## 2011-12-23 MED ORDER — CITALOPRAM HYDROBROMIDE 40 MG PO TABS
40.0000 mg | ORAL_TABLET | Freq: Every day | ORAL | Status: DC
  Administered 2011-12-23 – 2011-12-25 (×3): 40 mg via ORAL
  Filled 2011-12-23 (×3): qty 1

## 2011-12-23 MED ORDER — VALSARTAN-HYDROCHLOROTHIAZIDE 80-12.5 MG PO TABS: 1.0000 | ORAL_TABLET | Freq: Every day | ORAL | Status: DC

## 2011-12-23 MED ORDER — ALUM & MAG HYDROXIDE-SIMETH 200-200-20 MG/5ML PO SUSP: 30.0000 mL | Freq: Four times a day (QID) | ORAL | Status: DC | PRN

## 2011-12-23 MED ORDER — SODIUM CHLORIDE 0.9 % IJ SOLN: 3.0000 mL | INTRAMUSCULAR | Status: DC | PRN

## 2011-12-23 MED ORDER — VALSARTAN 80 MG PO TABS
80.0000 mg | ORAL_TABLET | Freq: Every day | ORAL | Status: DC
  Administered 2011-12-23 – 2011-12-25 (×3): 80 mg via ORAL
  Filled 2011-12-23 (×3): qty 1

## 2011-12-23 MED ORDER — LORAZEPAM 2 MG/ML IJ SOLN: 1.0000 mg | Freq: Once | INTRAMUSCULAR | Status: DC | PRN

## 2011-12-23 MED ORDER — THROMBIN 5000 UNITS EX KIT
PACK | CUTANEOUS | Status: DC | PRN
  Administered 2011-12-23 (×2): 5000 [IU] via TOPICAL

## 2011-12-23 MED ORDER — ONDANSETRON HCL 4 MG/2ML IJ SOLN
INTRAMUSCULAR | Status: DC | PRN
  Administered 2011-12-23: 4 mg via INTRAVENOUS

## 2011-12-23 MED ORDER — MELOXICAM 7.5 MG PO TABS
7.5000 mg | ORAL_TABLET | Freq: Every day | ORAL | Status: DC
  Administered 2011-12-23 – 2011-12-25 (×3): 7.5 mg via ORAL
  Filled 2011-12-23 (×3): qty 1

## 2011-12-23 MED ORDER — LACTATED RINGERS IV SOLN
INTRAVENOUS | Status: DC | PRN
  Administered 2011-12-23 (×2): via INTRAVENOUS

## 2011-12-23 MED ORDER — FENTANYL CITRATE 0.05 MG/ML IJ SOLN: 50.0000 ug | INTRAMUSCULAR | Status: DC | PRN

## 2011-12-23 MED ORDER — SODIUM CHLORIDE 0.9 % IR SOLN
Status: DC | PRN
  Administered 2011-12-23: 14:00:00

## 2011-12-23 MED ORDER — MENTHOL 3 MG MT LOZG
1.0000 | LOZENGE | OROMUCOSAL | Status: DC | PRN
  Administered 2011-12-24 – 2011-12-25 (×2): 3 mg via ORAL
  Filled 2011-12-23 (×2): qty 9

## 2011-12-23 MED ORDER — 0.9 % SODIUM CHLORIDE (POUR BTL) OPTIME
TOPICAL | Status: DC | PRN
  Administered 2011-12-23: 1000 mL

## 2011-12-23 MED ORDER — DIAZEPAM 5 MG PO TABS
5.0000 mg | ORAL_TABLET | Freq: Four times a day (QID) | ORAL | Status: DC | PRN
  Administered 2011-12-23 – 2011-12-25 (×6): 5 mg via ORAL
  Filled 2011-12-23 (×6): qty 1

## 2011-12-23 MED ORDER — SUCCINYLCHOLINE CHLORIDE 20 MG/ML IJ SOLN
INTRAMUSCULAR | Status: DC | PRN
  Administered 2011-12-23: 100 mg via INTRAVENOUS

## 2011-12-23 MED ORDER — BACITRACIN 50000 UNITS IM SOLR
INTRAMUSCULAR | Status: AC
  Filled 2011-12-23: qty 1

## 2011-12-23 MED ORDER — MORPHINE SULFATE 4 MG/ML IJ SOLN
1.0000 mg | INTRAMUSCULAR | Status: DC | PRN
  Administered 2011-12-23: 2 mg via INTRAVENOUS
  Administered 2011-12-24: 4 mg via INTRAVENOUS
  Filled 2011-12-23 (×2): qty 1

## 2011-12-23 MED ORDER — HEMOSTATIC AGENTS (NO CHARGE) OPTIME
TOPICAL | Status: DC | PRN
  Administered 2011-12-23: 1 via TOPICAL

## 2011-12-23 MED ORDER — FENTANYL CITRATE 0.05 MG/ML IJ SOLN
INTRAMUSCULAR | Status: DC | PRN
  Administered 2011-12-23: 250 ug via INTRAVENOUS
  Administered 2011-12-23: 100 ug via INTRAVENOUS

## 2011-12-23 MED ORDER — SODIUM CHLORIDE 0.9 % IJ SOLN
3.0000 mL | Freq: Two times a day (BID) | INTRAMUSCULAR | Status: DC
  Administered 2011-12-23 – 2011-12-24 (×3): 3 mL via INTRAVENOUS

## 2011-12-23 MED ORDER — HYDROCHLOROTHIAZIDE 12.5 MG PO CAPS
12.5000 mg | ORAL_CAPSULE | Freq: Every day | ORAL | Status: DC
  Administered 2011-12-24 – 2011-12-25 (×2): 12.5 mg via ORAL
  Filled 2011-12-23 (×3): qty 1

## 2011-12-23 MED ORDER — PROPOFOL 10 MG/ML IV EMUL
INTRAVENOUS | Status: DC | PRN
  Administered 2011-12-23: 200 mg via INTRAVENOUS

## 2011-12-23 MED ORDER — LIDOCAINE-EPINEPHRINE 1 %-1:100000 IJ SOLN
INTRAMUSCULAR | Status: DC | PRN
  Administered 2011-12-23: 3 mL

## 2011-12-23 MED ORDER — HYDROMORPHONE HCL PF 1 MG/ML IJ SOLN
INTRAMUSCULAR | Status: AC
  Filled 2011-12-23: qty 1

## 2011-12-23 MED ORDER — SODIUM CHLORIDE 0.9 % IV SOLN
250.0000 mL | INTRAVENOUS | Status: DC
  Administered 2011-12-23: 250 mL via INTRAVENOUS

## 2011-12-23 MED ORDER — ACETAMINOPHEN 325 MG PO TABS: 650.0000 mg | ORAL_TABLET | ORAL | Status: DC | PRN

## 2011-12-23 MED ORDER — DEXAMETHASONE SODIUM PHOSPHATE 4 MG/ML IJ SOLN
INTRAMUSCULAR | Status: DC | PRN
  Administered 2011-12-23: 10 mg via INTRAVENOUS

## 2011-12-23 MED ORDER — ACETAMINOPHEN 650 MG RE SUPP: 650.0000 mg | RECTAL | Status: DC | PRN

## 2011-12-23 MED ORDER — ADULT MULTIVITAMIN W/MINERALS CH
1.0000 | ORAL_TABLET | Freq: Every day | ORAL | Status: DC
  Administered 2011-12-23 – 2011-12-25 (×3): 1 via ORAL
  Filled 2011-12-23 (×3): qty 1

## 2011-12-23 MED ORDER — SODIUM CHLORIDE 0.9 % IV SOLN
INTRAVENOUS | Status: AC
  Filled 2011-12-23: qty 500

## 2011-12-23 MED ORDER — ONDANSETRON HCL 4 MG/2ML IJ SOLN: 4.0000 mg | INTRAMUSCULAR | Status: DC | PRN

## 2011-12-23 MED ORDER — BUPIVACAINE HCL (PF) 0.5 % IJ SOLN
INTRAMUSCULAR | Status: DC | PRN
  Administered 2011-12-23: 3 mL

## 2011-12-23 MED ORDER — PROMETHAZINE HCL 25 MG/ML IJ SOLN: 6.2500 mg | INTRAMUSCULAR | Status: DC | PRN

## 2011-12-23 MED ORDER — OXYCODONE-ACETAMINOPHEN 5-325 MG PO TABS
1.0000 | ORAL_TABLET | ORAL | Status: DC | PRN
  Administered 2011-12-23 – 2011-12-25 (×7): 2 via ORAL
  Filled 2011-12-23 (×7): qty 2

## 2011-12-23 MED ORDER — MIDAZOLAM HCL 2 MG/2ML IJ SOLN: 1.0000 mg | INTRAMUSCULAR | Status: DC | PRN

## 2011-12-23 MED ORDER — PHENOL 1.4 % MT LIQD
1.0000 | OROMUCOSAL | Status: DC | PRN
  Administered 2011-12-25: 1 via OROMUCOSAL
  Filled 2011-12-23: qty 177

## 2011-12-23 MED ORDER — ROCURONIUM BROMIDE 100 MG/10ML IV SOLN
INTRAVENOUS | Status: DC | PRN
  Administered 2011-12-23: 50 mg via INTRAVENOUS
  Administered 2011-12-23: 10 mg via INTRAVENOUS

## 2011-12-23 SURGICAL SUPPLY — 62 items
ADH SKN CLS APL DERMABOND .7 (GAUZE/BANDAGES/DRESSINGS) ×1
BAG DECANTER FOR FLEXI CONT (MISCELLANEOUS) ×2 IMPLANT
BANDAGE GAUZE ELAST BULKY 4 IN (GAUZE/BANDAGES/DRESSINGS) ×2 IMPLANT
BIT DRILL 14MM (INSTRUMENTS) IMPLANT
BIT DRILL NEURO 2X3.1 SFT TUCH (MISCELLANEOUS) ×1 IMPLANT
BONE CERVICAL 6MM LRG (Bone Implant) ×1 IMPLANT
BUR BARREL STRAIGHT FLUTE 4.0 (BURR) ×2 IMPLANT
CANISTER SUCTION 2500CC (MISCELLANEOUS) ×2 IMPLANT
CLOTH BEACON ORANGE TIMEOUT ST (SAFETY) ×2 IMPLANT
CONT SPEC 4OZ CLIKSEAL STRL BL (MISCELLANEOUS) ×3 IMPLANT
DECANTER SPIKE VIAL GLASS SM (MISCELLANEOUS) ×2 IMPLANT
DERMABOND ADHESIVE PROPEN ×1 IMPLANT
DERMABOND ADVANCED (GAUZE/BANDAGES/DRESSINGS) ×1
DERMABOND ADVANCED .7 DNX12 (GAUZE/BANDAGES/DRESSINGS) ×1 IMPLANT
DRAPE LAPAROTOMY 100X72 PEDS (DRAPES) ×2 IMPLANT
DRAPE MICROSCOPE LEICA (MISCELLANEOUS) IMPLANT
DRAPE POUCH INSTRU U-SHP 10X18 (DRAPES) ×2 IMPLANT
DRESSING TELFA 8X3 (GAUZE/BANDAGES/DRESSINGS) ×1 IMPLANT
DRILL 14MM (INSTRUMENTS) ×2
DRILL NEURO 2X3.1 SOFT TOUCH (MISCELLANEOUS)
DRSG OPSITE 4X5.5 SM (GAUZE/BANDAGES/DRESSINGS) ×2 IMPLANT
DURAPREP 6ML APPLICATOR 50/CS (WOUND CARE) ×2 IMPLANT
ELECT REM PT RETURN 9FT ADLT (ELECTROSURGICAL) ×2
ELECTRODE REM PT RTRN 9FT ADLT (ELECTROSURGICAL) ×1 IMPLANT
GAUZE SPONGE 4X4 16PLY XRAY LF (GAUZE/BANDAGES/DRESSINGS) IMPLANT
GLOVE BIO SURGEON STRL SZ7.5 (GLOVE) IMPLANT
GLOVE BIOGEL PI IND STRL 7.0 (GLOVE) IMPLANT
GLOVE BIOGEL PI IND STRL 7.5 (GLOVE) IMPLANT
GLOVE BIOGEL PI IND STRL 8.5 (GLOVE) ×1 IMPLANT
GLOVE BIOGEL PI INDICATOR 7.0 (GLOVE) ×1
GLOVE BIOGEL PI INDICATOR 7.5 (GLOVE) ×1
GLOVE BIOGEL PI INDICATOR 8.5 (GLOVE) ×1
GLOVE ECLIPSE 7.5 STRL STRAW (GLOVE) ×2 IMPLANT
GLOVE ECLIPSE 8.5 STRL (GLOVE) ×2 IMPLANT
GLOVE EXAM NITRILE LRG STRL (GLOVE) IMPLANT
GLOVE EXAM NITRILE MD LF STRL (GLOVE) IMPLANT
GLOVE EXAM NITRILE XL STR (GLOVE) IMPLANT
GLOVE EXAM NITRILE XS STR PU (GLOVE) IMPLANT
GLOVE SURG SS PI 6.5 STRL IVOR (GLOVE) ×1 IMPLANT
GOWN BRE IMP SLV AUR LG STRL (GOWN DISPOSABLE) ×1 IMPLANT
GOWN BRE IMP SLV AUR XL STRL (GOWN DISPOSABLE) ×3 IMPLANT
GOWN STRL REIN 2XL LVL4 (GOWN DISPOSABLE) ×2 IMPLANT
HEAD HALTER (SOFTGOODS) ×2 IMPLANT
KIT BASIN OR (CUSTOM PROCEDURE TRAY) ×2 IMPLANT
KIT ROOM TURNOVER OR (KITS) ×2 IMPLANT
NDL SPNL 22GX3.5 QUINCKE BK (NEEDLE) ×1 IMPLANT
NEEDLE HYPO 22GX1.5 SAFETY (NEEDLE) ×2 IMPLANT
NEEDLE SPNL 22GX3.5 QUINCKE BK (NEEDLE) ×2 IMPLANT
NS IRRIG 1000ML POUR BTL (IV SOLUTION) ×2 IMPLANT
PACK LAMINECTOMY NEURO (CUSTOM PROCEDURE TRAY) ×2 IMPLANT
PAD ARMBOARD 7.5X6 YLW CONV (MISCELLANEOUS) ×6 IMPLANT
PLATE TRESTLE LUXE 12 1LVL (Plate) ×1 IMPLANT
PUTTY BONE 1CC ×1 IMPLANT
RUBBERBAND STERILE (MISCELLANEOUS) IMPLANT
SCREW 14MM (Screw) ×4 IMPLANT
SPONGE INTESTINAL PEANUT (DISPOSABLE) ×2 IMPLANT
SPONGE SURGIFOAM ABS GEL SZ50 (HEMOSTASIS) ×2 IMPLANT
SUT VIC AB 3-0 SH 8-18 (SUTURE) ×2 IMPLANT
SYR 20ML ECCENTRIC (SYRINGE) ×2 IMPLANT
TOWEL OR 17X24 6PK STRL BLUE (TOWEL DISPOSABLE) ×2 IMPLANT
TOWEL OR 17X26 10 PK STRL BLUE (TOWEL DISPOSABLE) ×2 IMPLANT
WATER STERILE IRR 1000ML POUR (IV SOLUTION) ×2 IMPLANT

## 2011-12-23 NOTE — Op Note (Signed)
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 Asst.: Lelon Perla M.D.. Indications: A shunt is a 61 year old individual set previously acute cervical decompression and arthrodesis at C5-C6. She is now developed advanced spondylosis with spinal cord compression at C3-C4 she's been advised regarding the need for surgical decompression.   Procedure: The patient was brought to the operating room placed on the table in supine position. After the smooth induction of general endotracheal anesthesia neck was placed in 5 pounds of halter traction and prepped with alcohol and DuraPrep. After sterile draping and appropriate timeout procedure a transverse incision was created in the left side of the neck and carried down to the platysma. The plane between the sternocleidomastoid and strap muscles dissected bluntly until the prevertebral space was reached. The first identifiable disc space was noted to be C3-4 on a localizing radiograph. The dissection was then undertaken in the longus coli muscle to allow placement of a self-retaining Caspar type retractor.  The anterior longitudinal ligament was opened at C3-4 and ventral osteophytes were removed with a Leksell rongeur and Kerrison punch. Interspace was cleared of significant quantity of the degenerated disc material in the region of the posterior longitudinal ligament was reached. Dissection was carried out using a high-speed drill and 3-0 Karlin curettes. Uncinate processes were drilled down and removed and osteophytes from the inferior margin of the body of C-3were removed with a Kerrison 2 mm gold punch. After the central canal and lateral recesses were well  decompressed the stasis was achieved with the bipolar cautery and some small pledgets of Gelfoam soaked in thrombin that were later irrigated away.  A 6 mm transgraft was then prepared by enlarging the central cavity and filling with demineralized bone matrix and placing into the interspace.  Next the retractor was removed and a 12 mm trestle plate was placed over the vertebral bodies and secured with 14 mill variable angle screws. A final localizing radiograph identified the position of the surgical construct. The stasis was achieved in the soft tissues and then the platysma was closed with 3-0 Vicryl in an interrupted fashion and 3-0 Vicryl was used in the subcuticular tissue. Blood loss was estimated at 50 cc

## 2011-12-23 NOTE — H&P (Signed)
12/23/11 No changes since 12/11/11 Natalie Silva  #[id number] DOB:  Apr 04, 1951 12/11/2011:  Natalie Silva returns to the office having had an MRI of the lumbar spine. I had the opportunity to review this with her.  We are concerned about symptoms that she has been having down her right side, particularly in her right lower extremity.  She does have spondylosis in the cervical spine at C3-4 and again at C6-7 and I was most concerned about cord compression that she exhibits at the C3-4 level.    As regards to the lumbar spine, I note that she does have some modest diffuse spondylitic changes, most notably L2-3 and then again at L3-4 there is some disc bulging and perhaps even a small annular tear; however, the foramen are amply patent at every level. She does have some foraminal stenosis on the left side at L3-4, but this is contralateral to the side of any symptoms and she is asymptomatic on the left.    I indicated to Natalie Silva that I do not see a surgical lesion in her lumbar spine.  Referring back to her cervical spine, I again reviewed the studies, which shows that she has cord flattening across the entire width of her cord at C3-4.  At C6-7 she has some effacement of the cord on the left side, but no overt compression of it.  Her symptoms have been all right-sided.    Natalie Silva has previously had an anterior decompression and arthrodesis done in [place] back in the late 1990s; this was done with Allograft bone graft and no plate fixation.  She tells me that after that surgery she had to wear a hard collar for a number of weeks.    I noted to her that I believe that she should undergo decompression and arthrodesis at the level of C3-4.  This would involve a very similar operation to what she experienced previously; however, instead of using a hard cervical collar, we would place some internal fixation with a small Titanium plate and four screws.  Allograft bone is used obtained from a cadaver usually in the radius or the  fibula, and it does re-establish the height of the disc space after we remove the entirety of the disc to remove any pressure on the spinal cord and nerve roots at C3-4.  Typically an overnight hospital stay is all that is required.  Most patients can even go home the same day if the surgery is done early enough.  Thereafter a period of recovery of two to four weeks is typically what we see.  Patients may still have some residual soreness in their neck, but generally they are able to function with all their activities of daily living, getting about pretty much independently.  In my opinion, with the amount of cord compression that she has and her ongoing symptoms, Natalie Silva ultimately needs to consider getting this decompressed and stabilized.  We will schedule it at her convenience. She is aware of the surgery having undergone this back in 1998 and I am hopeful that this procedure would be tolerated as easily if not easier than what she had had done then.          Stefani Dama, M.D./aft  [facility] NEUROSURGICAL CONSULTATION Natalie Silva #[id number] DOB: 1951-09-29 October 25, 2008 HISTORY OF PRESENT ILLNESS: This is a new office visit for Natalie Silva. She was remotely seen in 2006 for an episode of low back pain where she was at that time  found to have a L3-4 foraminal protrusion and degenerative disk at L4-5. She.did undergo epidural steroid injections to treat her back pain. These were done at [facility]. She had left- sided L4-5 selective nerve roots blocks in 2006, 2007, and 2008, her most recent injection being 08/19/2007, which again was an intralaminar left-sided L4-5 ESI. Over the course of time, she has done fairly well; however, she is developing steady severe low back pain that is present 24 hours a day. Her primary radicular pattern is her left lower extremity down the side of her leg. She occasionally has numbness and tingling in her toes. She does  occasionally feel like her foot drags and that she has weakness in her lower extremity. She does not experience much radicular problems with her right lower extremity. She is on Vesicare for bladder control. She has no other bowel or bladder problems. She occasionally has headaches. No history of convulsions or seizures. No loss of consciousness. She does have difficulty sleeping due to the leg pain and pain on the lateral aspect of her hip. She denies any balance problems. She has not had any previous back surgery, but she did have anterior cervical decompression and fusion at C5-6 in July 1997 by Dr. Lynnda Child at [facility] in [place]. She is not able to tolerate anti inflammatories due to Lap-Band procedure and some intolerance from the GI standpoint to nonsteroidal anti-inflammatories. 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 She has had Lap-Band procedure done. PAST SURGICAL HISTORY: Positive for ACDF C5-6 in 1997 in [place], hysterectomy in 1994 in [place], gallbladder and hiatal hernia surgery in 1995 in [place], and Lap-Band procedure. ALLERGIES: SHE HAS AN ALLERGY TO CONTRAST DYE. IT GIVES HER HIVES. CURRENT MEDICATIONS: Multivitamins, Lipitor, Nexiurn, oxybutynin, Diovan, Celexa, aspirin, and Caltmte. FAMILY HISTORY: Her mother is 62 years old in fair health with degenerative disc disease. Father is 67 years old in good health. Diabetes and hypertension run in her family. SOCIAL HISTORY: She does not smoke. Positive alcohol use socially. Negative history of substance abuse. PHYSICAL EXAMINATION: Natalie Silva is a very pleasant, alert and oriented x57, 61 year old black female who ambulates with a fairly normal gait. She does go from seated to standing a bit slowly with some stifthess in her lower lumbar spine. She has negative straight leg raise. Her  motor strength is 5/5 on gross motor testing. Deep tendon reflexes are + 1 patellar and +1 Achilles, equal bilaterally. Negative clonus. Negative Hoffiuann's. Good grip strength. Good cervical range of motion. Well-healed surgical scar anteriorly from her previous neck surgery. Sensation is intact to soft touch. She does have diminished sensation in second, third, and fourth toes on the left subjectively. She is able to heel-and-toe walk. DIAGNOSTIC STUDIES: Plain radiographs show some diffuse degenerative changes throughout her lumbar spine. She has got diffuse spondylosis, a mild early degenerative scoliosis developing, and mild loss of lordosis. IMPRESSION: Acute-on-chronic low back pain with left lower extremity radiculopathy with multilevel degenerative disk disease and early degenerative scoliosis development with probable underlying foraminal stenosis, left-sided. PLAN: Due to her increased symptoms and the fact that she is having worsening pain and her significant left lower extremity radiculopathy, we feel that we should get a more current MM to evaluate what is going on in Natalie Silva's back. With current study, we can then consider designing an injection to address the  symptomatology, but the MM would be helpful to see where she is developing compression of the neural elements either with central stenosis, lateral recess, or foraminal stenosis, bulging disc, bone spurs, degenerative disk, etC. Her pathology is reviewed using a model, her plain radiographs, and her physical exam. All questions are encouraged, answered, and addressed. Dr. Erma Pinto sees the patient in the office today. Again, we will get MRI of the lumbar spine, we will then see her back and design a probable injection for treatment of her left lower extremity radiculopathy primarily. We will see if she responds to epidural steroids; if she does, we can consider repeating these periodically. If she does not, we may need to consider some  surgical intervention; but hopefully, we can manage her condition with conservative measures. She may also benefit from a course of physical therapy, core strengthening, McKenzie protocol, lumbar stabilization, and ultimately home exercise program. [facility] Hardin Negus PA-C Supervised by Stefani Dama M.D.

## 2011-12-23 NOTE — Anesthesia Preprocedure Evaluation (Signed)
Anesthesia Evaluation    Airway Mallampati: II TM Distance: >3 FB Neck ROM: Full    Dental   Pulmonary sleep apnea ,    Pulmonary exam normal       Cardiovascular METS: hypertension,     Neuro/Psych Depression    GI/Hepatic   Endo/Other  Morbid obesity  Renal/GU      Musculoskeletal   Abdominal (+) obese,   Peds  Hematology   Anesthesia Other Findings   Reproductive/Obstetrics                           Anesthesia Physical Anesthesia Plan  ASA: III  Anesthesia Plan: General   Post-op Pain Management:    Induction: Intravenous  Airway Management Planned: Oral ETT  Additional Equipment:   Intra-op Plan:   Post-operative Plan: Extubation in OR  Informed Consent: I have reviewed the patients History and Physical, chart, labs and discussed the procedure including the risks, benefits and alternatives for the proposed anesthesia with the patient or authorized representative who has indicated his/her understanding and acceptance.     Plan Discussed with: CRNA and Surgeon  Anesthesia Plan Comments:         Anesthesia Quick Evaluation

## 2011-12-23 NOTE — Preoperative (Signed)
Beta Blockers   Reason not to administer Beta Blockers:Not Applicable 

## 2011-12-23 NOTE — Anesthesia Postprocedure Evaluation (Signed)
  Anesthesia Post-op Note  Patient: Natalie Silva  Procedure(s) Performed: Procedure(s) (LRB): ANTERIOR CERVICAL DECOMPRESSION/DISCECTOMY FUSION 1 LEVEL (N/A)  Patient Location: PACU  Anesthesia Type: General  Level of Consciousness: awake  Airway and Oxygen Therapy: Patient Spontanous Breathing  Post-op Pain: mild  Post-op Assessment: Post-op Vital signs reviewed, Patient's Cardiovascular Status Stable, Respiratory Function Stable, Patent Airway, No signs of Nausea or vomiting and Pain level controlled  Post-op Vital Signs: stable  Complications: No apparent anesthesia complications

## 2011-12-23 NOTE — Progress Notes (Signed)
Subjective: Patient reports some shoulder pain, no numbness swallow ok  Objective: Vital signs in last 24 hours: Temp:  [98 F (36.7 C)-98.5 F (36.9 C)] 98 F (36.7 C) (02/26 1640) Pulse Rate:  [64-72] 72  (02/26 1640) Resp:  [18-20] 18  (02/26 1640) BP: (158-160)/(92-95) 160/95 mmHg (02/26 1640) SpO2:  [94 %-100 %] 94 % (02/26 1640)  Intake/Output from previous day:   Intake/Output this shift: Total I/O In: 1500 [I.V.:1500] Out: -   incision clean and dry . Motor function intact.  Lab Results: No results found for this basename: WBC:2,HGB:2,HCT:2,PLT:2 in the last 72 hours BMET No results found for this basename: NA:2,K:2,CL:2,CO2:2,GLUCOSE:2,BUN:2,CREATININE:2,CALCIUM:2 in the last 72 hours  Studies/Results: Dg Cervical Spine 2-3 Views  12/23/2011  *RADIOLOGY REPORT*  Clinical Data: Cervical fusion  CERVICAL SPINE - 2-3 VIEW  Comparison: 11/21/2011  Findings: This single lateral intraoperative cervical spine radiograph demonstrates changes of anterior cervical discectomy and fusion C3-4.  Interbody graft and anterior hardware project in expected location.  The lower cervical spine is not well visualized.  IMPRESSION:  1.  ACDF C3-4  Original Report Authenticated By: Osa Craver, M.D.    Assessment/Plan: Stable post op.  LOS: 0 days  Observation, mobilization   Calieb Lichtman J 12/23/2011, 5:13 PM

## 2011-12-23 NOTE — Discharge Instructions (Signed)
Wound Care Leave incision open to air. You may shower. Do not scrub directly on incision.  Do not put any creams, lotions, or ointments on incision. Activity Walk each and every day, increasing distance each day. No lifting greater than 5 lbs.  Avoid excessive neck motion. No driving for 2 weeks; may ride as a passenger locally.  If provided soft collar, may wear for comfort unless otherwise instructed. Diet Resume your normal diet.  Return to Work Will be discussed at you follow up appointment. Call Your Doctor If Any of These Occur Redness, drainage, or swelling at the wound.  Temperature greater than 101 degrees. Severe pain not relieved by pain medication. Increased difficulty swallowing. Incision starts to come apart. Follow Up Appt Call today for appointment in 4 weeks ([phone]) or for problems.  If you have any hardware placed in your spine, you will need an x-ray before your appointment.

## 2011-12-23 NOTE — Transfer of Care (Signed)
Immediate Anesthesia Transfer of Care Note  Patient: Natalie Silva  Procedure(s) Performed: Procedure(s) (LRB): ANTERIOR CERVICAL DECOMPRESSION/DISCECTOMY FUSION 1 LEVEL (N/A)  Patient Location: PACU  Anesthesia Type: General  Level of Consciousness: awake and alert   Airway & Oxygen Therapy: Patient Spontanous Breathing and Patient connected to nasal cannula oxygen  Post-op Assessment: Report given to PACU RN and Post -op Vital signs reviewed and stable  Post vital signs: Reviewed and stable  Complications: No apparent anesthesia complications

## 2011-12-23 NOTE — Anesthesia Procedure Notes (Signed)
Procedure Name: Intubation Date/Time: 12/23/2011 12:51 PM Performed by: Gwenyth Allegra Pre-anesthesia Checklist: Patient identified, Timeout performed, Emergency Drugs available, Suction available and Patient being monitored Patient Re-evaluated:Patient Re-evaluated prior to inductionOxygen Delivery Method: Circle system utilized Preoxygenation: Pre-oxygenation with 100% oxygen Intubation Type: IV induction Ventilation: Mask ventilation without difficulty and Oral airway inserted - appropriate to patient size Grade View: Grade II Tube size: 7.5 mm Number of attempts: 1 Airway Equipment and Method: Stylet and Video-laryngoscopy Placement Confirmation: ETT inserted through vocal cords under direct vision,  positive ETCO2 and breath sounds checked- equal and bilateral Secured at: 22 cm Tube secured with: Tape Dental Injury: Teeth and Oropharynx as per pre-operative assessment

## 2011-12-24 LAB — GLUCOSE, CAPILLARY: Glucose-Capillary: 106 mg/dL — ABNORMAL HIGH (ref 70–99)

## 2011-12-24 NOTE — Progress Notes (Signed)
Subjective: Patient reports Alert and oriented complains of neck and shoulder pain. Worried about discharge because patient lives alone. Like to spend another day.  Objective: Vital signs in last 24 hours: Temp:  [97.8 F (36.6 C)-98.5 F (36.9 C)] 98.1 F (36.7 C) (02/27 0751) Pulse Rate:  [70-87] 87  (02/27 0751) Resp:  [14-18] 16  (02/27 0751) BP: (131-160)/(72-95) 154/89 mmHg (02/27 0751) SpO2:  [93 %-96 %] 95 % (02/27 0751)  Intake/Output from previous day: 02/26 0701 - 02/27 0700 In: 1980 [P.O.:480; I.V.:1500] Out: -  Intake/Output this shift: Total I/O In: 240 [P.O.:240] Out: -   Incision is clean and dry slight edema around incision borders. Motor function is good in the deltoids biceps grips and intrinsics.  Lab Results: No results found for this basename: WBC:2,HGB:2,HCT:2,PLT:2 in the last 72 hours BMET No results found for this basename: NA:2,K:2,CL:2,CO2:2,GLUCOSE:2,BUN:2,CREATININE:2,CALCIUM:2 in the last 72 hours  Studies/Results: Dg Cervical Spine 2-3 Views  12/23/2011  *RADIOLOGY REPORT*  Clinical Data: Cervical fusion  CERVICAL SPINE - 2-3 VIEW  Comparison: 11/21/2011  Findings: This single lateral intraoperative cervical spine radiograph demonstrates changes of anterior cervical discectomy and fusion C3-4.  Interbody graft and anterior hardware project in expected location.  The lower cervical spine is not well visualized.  IMPRESSION:  1.  ACDF C3-4  Original Report Authenticated By: Osa Craver, M.D.    Assessment/Plan: Patient appears stable postop day 1 from C3-C4 anterior cervical decompression and fusion patient would like to spend another day in hospital because she lives alone does not have outside support.  LOS: 1 day  Reassured patient of her stability however will allow patient to remain in hospital until she is comfortable with discharge to home.   Else Habermann J 12/24/2011, 9:04 AM

## 2011-12-24 NOTE — Progress Notes (Signed)
Pt up ambulating in halls, showered, tol well

## 2011-12-25 ENCOUNTER — Encounter (HOSPITAL_COMMUNITY): Payer: Self-pay | Admitting: Neurological Surgery

## 2011-12-25 LAB — GLUCOSE, CAPILLARY

## 2011-12-25 MED ORDER — DIAZEPAM 5 MG PO TABS: 5.0000 mg | ORAL_TABLET | Freq: Four times a day (QID) | ORAL | Status: AC | PRN

## 2011-12-25 MED ORDER — OXYCODONE-ACETAMINOPHEN 5-325 MG PO TABS: 1.0000 | ORAL_TABLET | ORAL | Status: AC | PRN

## 2011-12-30 NOTE — Discharge Summary (Signed)
Physician Discharge Summary  Patient ID: Natalie Silva MRN: [id number] DOB/AGE: 05-Dec-1950 60 y.o.  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 Status post anterior cervical decompression C4-5 C5-6  Discharge Diagnoses: Cervical spondylosis with C3-4 with myelopathy. Status post anterior cervical decompression C4-5 and C5-6  Active Problems:  * No active hospital problems. *    Discharged Condition: good  Hospital Course: She was admitted to undergo anterior cervical decompression arthrodesis which was performed without difficulty she tolerated the surgery well is ambulatory having less neck and shoulder pain swallowing well.  Consults: None  Significant Diagnostic Studies: None  Treatments: surgery: Anterior cervical decompression C3-4 arthrodesis with structural allograft anterior plate fixation W0-9  Discharge Exam: Blood pressure 123/64, pulse 66, temperature 99.3 F (37.4 C), temperature source Oral, resp. rate 18, SpO2 95.00%. Incision clean and dry motor function intact in upper extremities and lower extremities station and gait normal  Disposition: 01-Home or Self Care  Discharge Orders    Future Orders Please Complete By Expires   Diet - low sodium heart healthy      Increase activity slowly      Discharge instructions      Comments:   Okay to shower. Do not apply salves or appointments to incision. No heavy lifting with the upper extremities greater than 15 pounds. May resume driving when not requiring pain medication and patient feels comfortable with doing so.   Call MD for:  temperature >100.4      Call MD for:  severe uncontrolled pain      Call MD for:  redness, tenderness, or signs of infection (pain, swelling, redness, odor or green/yellow discharge around incision site)        Medication List  As of 12/30/2011  3:28 PM   TAKE these medications         citalopram 40 MG tablet   Commonly known as: CELEXA   Take 40 mg by mouth daily.      diazepam 5 MG tablet   Commonly known as: VALIUM   Take 1 tablet (5 mg total) by mouth every 6 (six) hours as needed (Muscle spasm).      meloxicam 7.5 MG tablet   Commonly known as: MOBIC   Take 7.5 mg by mouth daily.      mulitivitamin with minerals Tabs   Take 1 tablet by mouth daily.      oxyCODONE-acetaminophen 5-325 MG per tablet   Commonly known as: PERCOCET   Take 1-2 tablets by mouth every 4 (four) hours as needed for pain.      valsartan-hydrochlorothiazide 80-12.5 MG per tablet   Commonly known as: DIOVAN-HCT   Take 1 tablet by mouth daily.           Follow-up Information    Follow up with Stefani Dama, MD. Schedule an appointment as soon as possible for a visit in 3 weeks. (Call Aram Beecham for appointment)    Contact information:   [address] [place] [address] [phone]          Signed: Stefani Dama 12/30/2011, 3:28 PM

## 2012-01-21 ENCOUNTER — Other Ambulatory Visit: Payer: Self-pay | Admitting: Obstetrics and Gynecology

## 2012-01-21 DIAGNOSIS — Z1231 Encounter for screening mammogram for malignant neoplasm of breast: Secondary | ICD-10-CM

## 2012-02-23 ENCOUNTER — Ambulatory Visit
Admission: RE | Admit: 2012-02-23 | Discharge: 2012-02-23 | Disposition: A | Discharge status: Home / Self Care | Payer: BC Managed Care – PPO | Source: Ambulatory Visit | Attending: Obstetrics and Gynecology | Admitting: Obstetrics and Gynecology

## 2012-02-23 DIAGNOSIS — Z1231 Encounter for screening mammogram for malignant neoplasm of breast: Secondary | ICD-10-CM

## 2012-02-25 ENCOUNTER — Ambulatory Visit: Payer: BC Managed Care – PPO

## 2012-07-06 ENCOUNTER — Emergency Department (HOSPITAL_BASED_OUTPATIENT_CLINIC_OR_DEPARTMENT_OTHER)
Admission: EM | Admit: 2012-07-06 | Discharge: 2012-07-06 | Disposition: A | Discharge status: Home / Self Care | Payer: Self-pay | Attending: Emergency Medicine | Admitting: Emergency Medicine

## 2012-07-06 ENCOUNTER — Emergency Department (HOSPITAL_BASED_OUTPATIENT_CLINIC_OR_DEPARTMENT_OTHER): Payer: Self-pay

## 2012-07-06 ENCOUNTER — Encounter (HOSPITAL_BASED_OUTPATIENT_CLINIC_OR_DEPARTMENT_OTHER): Payer: Self-pay | Admitting: Emergency Medicine

## 2012-07-06 DIAGNOSIS — M129 Arthropathy, unspecified: Secondary | ICD-10-CM | POA: Insufficient documentation

## 2012-07-06 DIAGNOSIS — M79675 Pain in left toe(s): Secondary | ICD-10-CM

## 2012-07-06 DIAGNOSIS — M79609 Pain in unspecified limb: Secondary | ICD-10-CM | POA: Insufficient documentation

## 2012-07-06 DIAGNOSIS — K219 Gastro-esophageal reflux disease without esophagitis: Secondary | ICD-10-CM | POA: Insufficient documentation

## 2012-07-06 DIAGNOSIS — G4730 Sleep apnea, unspecified: Secondary | ICD-10-CM | POA: Insufficient documentation

## 2012-07-06 DIAGNOSIS — I10 Essential (primary) hypertension: Secondary | ICD-10-CM | POA: Insufficient documentation

## 2012-07-06 NOTE — Discharge Instructions (Signed)
Pain of Unknown Etiology (Pain Without a Known Cause) You have come to your caregiver because of pain. Pain can occur in any part of the body. Often there is not a definite cause. If your laboratory (blood or urine) work was normal and x-rays or other studies were normal, your caregiver may treat you without knowing the cause of the pain. An example of this is the headache. Most headaches are diagnosed by taking a history. This means your caregiver asks you questions about your headaches. Your caregiver determines a treatment based on your answers. Usually testing done for headaches is normal. Often testing is not done unless there is no response to medications. Regardless of where your pain is located today, you can be given medications to make you comfortable. If no physical cause of pain can be found, most cases of pain will gradually leave as suddenly as they came.  If you have a painful condition and no reason can be found for the pain, It is importantthat you follow up with your caregiver. If the pain becomes worse or does not go away, it may be necessary to repeat tests and look further for a possible cause.  Only take over-the-counter or prescription medicines for pain, discomfort, or fever as directed by your caregiver.   For the protection of your privacy, test results can not be given over the phone. Make sure you receive the results of your test. Ask as to how these results are to be obtained if you have not been informed. It is your responsibility to obtain your test results.   You may continue all activities unless the activities cause more pain. When the pain lessens, it is important to gradually resume normal activities. Resume activities by beginning slowly and gradually increasing the intensity and duration of the activities or exercise. During periods of severe pain, bed-rest may be helpful. Lay or sit in any position that is comfortable.   Ice used for acute (sudden) conditions may be  effective. Use a large plastic bag filled with ice and wrapped in a towel. This may provide pain relief.   See your caregiver for continued problems. They can help or refer you for exercises or physical therapy if necessary.  If you were given medications for your condition, do not drive, operate machinery or power tools, or sign legal documents for 24 hours. Do not drink alcohol, take sleeping pills, or take other medications that may interfere with treatment. See your caregiver immediately if you have pain that is becoming worse and not relieved by medications. Document Released: 07/08/2001 Document Revised: 10/02/2011 Document Reviewed: 10/13/2005 [employer] Patient Information 2012 [employer]. 

## 2012-07-06 NOTE — ED Notes (Signed)
Pt reports injury to left second toe x 2 weeks ago, and has noticed discoloration under left great toe nail tonight.

## 2012-07-06 NOTE — ED Provider Notes (Signed)
History     CSN: [id number]  Arrival date & time 07/06/12  0151   First MD Initiated Contact with Patient 07/06/12 0325      Chief Complaint  Patient presents with  . Toe Injury    (Consider location/radiation/quality/duration/timing/severity/associated sxs/prior treatment) HPI Comments: Pt reports no injury, has developed hammer toe on left foot, second toe and reports she had buddy taped it.  It has begun to feel numb and pain was worse.  She came essentially to get xray which was performed by nursing protocol.  She also reports having taken off toe nail polish, nail of first toe seemed a little "black and blue."  No redness, no weakness.  She has sig h/o diet controlled DM.  No gout.  She had some pain walking on it, bending makes it hurt slightly worse.  Pt denies any treatment prior to arrival other than removing the buddy tape.  The history is provided by the patient.    Past Medical History  Diagnosis Date  . Hypertension   . Neck pain on right side   . Depression   . Arthritis     knee  . Back pain   . Sleep apnea 2008    does not use cpap regularly  . H/O diabetes mellitus   . GERD (gastroesophageal reflux disease)     Past Surgical History  Procedure Date  . Anterior fusion cervical spine 1998  . Cholecystectomy   . Tonsillectomy   . Hernia repair     hiatal hernia  . Laparoscopic gastric banding   . Abdominal hysterectomy   . Anterior cervical decomp/discectomy fusion 12/23/2011    Procedure: ANTERIOR CERVICAL DECOMPRESSION/DISCECTOMY FUSION 1 LEVEL;  Surgeon: Stefani Dama, MD;  Location: [facility];  Service: Neurosurgery;  Laterality: N/A;  Cervical Three-four Anterior cervical decompression/diskectomy fusion    History reviewed. No pertinent family history.  History  Substance Use Topics  . Smoking status: Not on file  . Smokeless tobacco: Not on file  . Alcohol Use: No    OB History    Grav Para Term Preterm Abortions TAB SAB Ect Mult Living                    Review of Systems  Constitutional: Negative for fever and chills.  Musculoskeletal: Positive for arthralgias.  Skin: Negative for color change, pallor and wound.  Neurological: Positive for numbness. Negative for weakness and headaches.    Allergies  Contrast media and Iohexol  Home Medications   Current Outpatient Rx  Name Route Sig Dispense Refill  . CITALOPRAM HYDROBROMIDE 40 MG PO TABS Oral Take 40 mg by mouth daily.    . MELOXICAM 7.5 MG PO TABS Oral Take 7.5 mg by mouth daily.    . ADULT MULTIVITAMIN W/MINERALS CH Oral Take 1 tablet by mouth daily.    Marland Kitchen VALSARTAN-HYDROCHLOROTHIAZIDE 80-12.5 MG PO TABS Oral Take 1 tablet by mouth daily.      BP 155/93  Pulse 70  Temp 98.2 F (36.8 C) (Oral)  Resp 16  SpO2 95%  Physical Exam  Nursing note and vitals reviewed. Constitutional: She appears well-developed and well-nourished. No distress.  Cardiovascular: Intact distal pulses.   Musculoskeletal:       Nail fungus to both first toes bilaterally.  Hammer toe to 2nd and 3rd toes on both feet.  FROM of MTP of all toes, no rash, cap refill normal, 2+ DP pulses.  Soft compartments to lower legs, no rash  Skin: Skin is warm and dry. No rash noted. No pallor.    ED Course  Procedures (including critical care time)  Labs Reviewed - No data to display Dg Foot Complete Left  07/06/2012  *RADIOLOGY REPORT*  Clinical Data: Left foot pain at the first and second toes. Bruising under the nail bed of the great toe.  LEFT FOOT - COMPLETE 3+ VIEW  Comparison: 05/21/2005  Findings: The left foot appears intact. No evidence of acute fracture or subluxation.  No focal bone lesions.  Bone matrix and cortex appear intact.  No abnormal radiopaque densities in the soft tissues.  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 Plantar calcaneal spur.  IMPRESSION: No acute bony abnormality.   Original Report Authenticated By: Marlon Pel, M.D.      1. Toe pain, left        MDM  Pt is reassured.  No signs of infection or trauma.  Pt counseled about onychomycosis of toes.  Pt is reassured films are neg for fracture and she is ok going home and follow up with PCP.        Gavin Pound. Oletta Lamas, MD 07/06/12 [phone]

## 2013-01-12 ENCOUNTER — Other Ambulatory Visit: Payer: Self-pay

## 2013-01-12 DIAGNOSIS — Z1231 Encounter for screening mammogram for malignant neoplasm of breast: Secondary | ICD-10-CM

## 2013-02-28 ENCOUNTER — Ambulatory Visit
Admission: RE | Admit: 2013-02-28 | Discharge: 2013-02-28 | Disposition: A | Discharge status: Home / Self Care | Payer: BC Managed Care – PPO | Source: Ambulatory Visit

## 2013-02-28 DIAGNOSIS — Z1231 Encounter for screening mammogram for malignant neoplasm of breast: Secondary | ICD-10-CM

## 2014-01-11 ENCOUNTER — Other Ambulatory Visit: Payer: Self-pay

## 2014-01-11 DIAGNOSIS — Z1231 Encounter for screening mammogram for malignant neoplasm of breast: Secondary | ICD-10-CM

## 2014-03-01 ENCOUNTER — Ambulatory Visit
Admission: RE | Admit: 2014-03-01 | Discharge: 2014-03-01 | Disposition: A | Discharge status: Home / Self Care | Payer: BC Managed Care – PPO | Source: Ambulatory Visit

## 2014-03-01 ENCOUNTER — Encounter (INDEPENDENT_AMBULATORY_CARE_PROVIDER_SITE_OTHER): Payer: Self-pay

## 2014-03-01 DIAGNOSIS — Z1231 Encounter for screening mammogram for malignant neoplasm of breast: Secondary | ICD-10-CM

## 2014-12-26 ENCOUNTER — Other Ambulatory Visit: Payer: Self-pay

## 2014-12-26 DIAGNOSIS — Z1231 Encounter for screening mammogram for malignant neoplasm of breast: Secondary | ICD-10-CM

## 2015-03-05 ENCOUNTER — Ambulatory Visit: Payer: Self-pay

## 2015-03-06 ENCOUNTER — Other Ambulatory Visit: Payer: Self-pay | Admitting: Obstetrics and Gynecology

## 2015-03-06 ENCOUNTER — Ambulatory Visit
Admission: RE | Admit: 2015-03-06 | Discharge: 2015-03-06 | Disposition: A | Discharge status: Home / Self Care | Payer: 59 | Source: Ambulatory Visit

## 2015-03-06 DIAGNOSIS — Z1231 Encounter for screening mammogram for malignant neoplasm of breast: Secondary | ICD-10-CM

## 2015-03-06 DIAGNOSIS — R928 Other abnormal and inconclusive findings on diagnostic imaging of breast: Secondary | ICD-10-CM

## 2015-03-14 ENCOUNTER — Other Ambulatory Visit: Payer: Self-pay | Admitting: Family Medicine

## 2015-03-14 ENCOUNTER — Other Ambulatory Visit: Payer: Self-pay | Admitting: Internal Medicine

## 2015-03-14 ENCOUNTER — Other Ambulatory Visit: Payer: Self-pay

## 2015-03-14 DIAGNOSIS — R928 Other abnormal and inconclusive findings on diagnostic imaging of breast: Secondary | ICD-10-CM

## 2015-03-19 ENCOUNTER — Ambulatory Visit
Admission: RE | Admit: 2015-03-19 | Discharge: 2015-03-19 | Disposition: A | Discharge status: Home / Self Care | Payer: 59 | Source: Ambulatory Visit | Attending: Family Medicine | Admitting: Family Medicine

## 2015-03-19 ENCOUNTER — Ambulatory Visit
Admission: RE | Admit: 2015-03-19 | Discharge: 2015-03-19 | Disposition: A | Discharge status: Home / Self Care | Payer: 59 | Source: Ambulatory Visit | Attending: Internal Medicine | Admitting: Internal Medicine

## 2015-03-19 DIAGNOSIS — R928 Other abnormal and inconclusive findings on diagnostic imaging of breast: Secondary | ICD-10-CM

## 2016-01-31 ENCOUNTER — Other Ambulatory Visit: Payer: Self-pay

## 2016-01-31 DIAGNOSIS — Z1231 Encounter for screening mammogram for malignant neoplasm of breast: Secondary | ICD-10-CM

## 2016-03-04 ENCOUNTER — Inpatient Hospital Stay (HOSPITAL_COMMUNITY): Payer: Medicare Other

## 2016-03-04 ENCOUNTER — Inpatient Hospital Stay (HOSPITAL_COMMUNITY)
Admission: EM | Admit: 2016-03-04 | Discharge: 2016-03-10 | DRG: 390 | Disposition: A | Discharge status: Home / Self Care | Payer: Medicare Other | Attending: Surgery | Admitting: Surgery

## 2016-03-04 ENCOUNTER — Encounter (HOSPITAL_COMMUNITY): Payer: Self-pay | Admitting: Emergency Medicine

## 2016-03-04 ENCOUNTER — Emergency Department (HOSPITAL_COMMUNITY): Payer: Medicare Other

## 2016-03-04 DIAGNOSIS — Z91041 Radiographic dye allergy status: Secondary | ICD-10-CM

## 2016-03-04 DIAGNOSIS — Z79899 Other long term (current) drug therapy: Secondary | ICD-10-CM

## 2016-03-04 DIAGNOSIS — K56609 Unspecified intestinal obstruction, unspecified as to partial versus complete obstruction: Secondary | ICD-10-CM | POA: Diagnosis present

## 2016-03-04 DIAGNOSIS — Z9884 Bariatric surgery status: Secondary | ICD-10-CM

## 2016-03-04 DIAGNOSIS — K219 Gastro-esophageal reflux disease without esophagitis: Secondary | ICD-10-CM | POA: Diagnosis present

## 2016-03-04 DIAGNOSIS — E119 Type 2 diabetes mellitus without complications: Secondary | ICD-10-CM | POA: Diagnosis present

## 2016-03-04 DIAGNOSIS — M1990 Unspecified osteoarthritis, unspecified site: Secondary | ICD-10-CM | POA: Diagnosis present

## 2016-03-04 DIAGNOSIS — R000 Tachycardia, unspecified: Secondary | ICD-10-CM | POA: Diagnosis present

## 2016-03-04 DIAGNOSIS — E669 Obesity, unspecified: Secondary | ICD-10-CM | POA: Diagnosis present

## 2016-03-04 DIAGNOSIS — Z6839 Body mass index (BMI) 39.0-39.9, adult: Secondary | ICD-10-CM

## 2016-03-04 DIAGNOSIS — G4730 Sleep apnea, unspecified: Secondary | ICD-10-CM | POA: Diagnosis present

## 2016-03-04 DIAGNOSIS — I10 Essential (primary) hypertension: Secondary | ICD-10-CM | POA: Diagnosis present

## 2016-03-04 DIAGNOSIS — Z791 Long term (current) use of non-steroidal anti-inflammatories (NSAID): Secondary | ICD-10-CM | POA: Diagnosis not present

## 2016-03-04 DIAGNOSIS — K5660 Unspecified intestinal obstruction: Principal | ICD-10-CM | POA: Diagnosis present

## 2016-03-04 DIAGNOSIS — Z90710 Acquired absence of both cervix and uterus: Secondary | ICD-10-CM | POA: Diagnosis not present

## 2016-03-04 DIAGNOSIS — Z0189 Encounter for other specified special examinations: Secondary | ICD-10-CM

## 2016-03-04 DIAGNOSIS — R112 Nausea with vomiting, unspecified: Secondary | ICD-10-CM | POA: Diagnosis present

## 2016-03-04 HISTORY — DX: Type 2 diabetes mellitus without complications: E11.9

## 2016-03-04 LAB — URINALYSIS, ROUTINE W REFLEX MICROSCOPIC
Glucose, UA: NEGATIVE mg/dL
Ketones, ur: 40 mg/dL — AB
LEUKOCYTES UA: NEGATIVE
NITRITE: NEGATIVE
PH: 5.5 (ref 5.0–8.0)
Protein, ur: >300 mg/dL — AB
SPECIFIC GRAVITY, URINE: 1.025 (ref 1.005–1.030)

## 2016-03-04 LAB — URINE MICROSCOPIC-ADD ON

## 2016-03-04 LAB — CBC WITH DIFFERENTIAL/PLATELET
BASOS PCT: 0 %
Basophils Absolute: 0 10*3/uL (ref 0.0–0.1)
Basophils Absolute: 0 10*3/uL (ref 0.0–0.1)
Basophils Relative: 0 %
EOS PCT: 0 %
Eosinophils Absolute: 0 10*3/uL (ref 0.0–0.7)
Eosinophils Absolute: 0 10*3/uL (ref 0.0–0.7)
Eosinophils Relative: 0 %
HEMATOCRIT: 42 % (ref 36.0–46.0)
HEMATOCRIT: 38.5 % (ref 36.0–46.0)
Hemoglobin: 15.3 g/dL — ABNORMAL HIGH (ref 12.0–15.0)
Hemoglobin: 14 g/dL (ref 12.0–15.0)
LYMPHS ABS: 0.7 10*3/uL (ref 0.7–4.0)
Lymphocytes Relative: 7 %
Lymphocytes Relative: 7 %
Lymphs Abs: 0.8 10*3/uL (ref 0.7–4.0)
MCH: 27.3 pg (ref 26.0–34.0)
MCH: 27 pg (ref 26.0–34.0)
MCHC: 36.4 g/dL — ABNORMAL HIGH (ref 30.0–36.0)
MCHC: 36.4 g/dL — ABNORMAL HIGH (ref 30.0–36.0)
MCV: 74.9 fL — AB (ref 78.0–100.0)
MCV: 74.2 fL — AB (ref 78.0–100.0)
MONO ABS: 0.8 10*3/uL (ref 0.1–1.0)
MONOS PCT: 5 %
Monocytes Absolute: 0.5 10*3/uL (ref 0.1–1.0)
Monocytes Relative: 7 %
NEUTROS ABS: 9.7 10*3/uL — AB (ref 1.7–7.7)
NEUTROS PCT: 86 %
Neutro Abs: 8.9 10*3/uL — ABNORMAL HIGH (ref 1.7–7.7)
Neutrophils Relative %: 88 %
Platelets: 262 10*3/uL (ref 150–400)
Platelets: 254 10*3/uL (ref 150–400)
RBC: 5.61 MIL/uL — AB (ref 3.87–5.11)
RBC: 5.19 MIL/uL — AB (ref 3.87–5.11)
RDW: 14.8 % (ref 11.5–15.5)
RDW: 14.7 % (ref 11.5–15.5)
WBC: 10.1 10*3/uL (ref 4.0–10.5)
WBC: 11.3 10*3/uL — ABNORMAL HIGH (ref 4.0–10.5)

## 2016-03-04 LAB — CBC

## 2016-03-04 LAB — COMPREHENSIVE METABOLIC PANEL
ALT: 20 U/L (ref 14–54)
AST: 24 U/L (ref 15–41)
Albumin: 4.7 g/dL (ref 3.5–5.0)
Alkaline Phosphatase: 60 U/L (ref 38–126)
Anion gap: 15 (ref 5–15)
BUN: 20 mg/dL (ref 6–20)
CALCIUM: 10 mg/dL (ref 8.9–10.3)
CHLORIDE: 100 mmol/L — AB (ref 101–111)
CO2: 23 mmol/L (ref 22–32)
CREATININE: 0.92 mg/dL (ref 0.44–1.00)
GFR calc non Af Amer: >60 mL/min (ref >60)
GLUCOSE: 207 mg/dL — AB (ref 65–99)
Potassium: 3.4 mmol/L — ABNORMAL LOW (ref 3.5–5.1)
Sodium: 138 mmol/L (ref 135–145)
Total Bilirubin: 0.9 mg/dL (ref 0.3–1.2)
Total Protein: 8.4 g/dL — ABNORMAL HIGH (ref 6.5–8.1)

## 2016-03-04 LAB — LIPASE, BLOOD: Lipase: 22 U/L (ref 11–51)

## 2016-03-04 MED ORDER — DIPHENHYDRAMINE HCL 50 MG/ML IJ SOLN
25.0000 mg | Freq: Four times a day (QID) | INTRAMUSCULAR | Status: DC | PRN
  Administered 2016-03-08: 25 mg via INTRAVENOUS
  Filled 2016-03-04: qty 1

## 2016-03-04 MED ORDER — ONDANSETRON HCL 4 MG/2ML IJ SOLN
4.0000 mg | Freq: Once | INTRAMUSCULAR | Status: AC
  Administered 2016-03-04: 4 mg via INTRAVENOUS
  Filled 2016-03-04: qty 2

## 2016-03-04 MED ORDER — SODIUM CHLORIDE 0.9 % IV BOLUS (SEPSIS)
500.0000 mL | Freq: Once | INTRAVENOUS | Status: AC
  Administered 2016-03-04: 500 mL via INTRAVENOUS

## 2016-03-04 MED ORDER — CETYLPYRIDINIUM CHLORIDE 0.05 % MT LIQD
7.0000 mL | Freq: Two times a day (BID) | OROMUCOSAL | Status: DC
  Administered 2016-03-05 – 2016-03-09 (×8): 7 mL via OROMUCOSAL

## 2016-03-04 MED ORDER — HYDROMORPHONE HCL 1 MG/ML IJ SOLN
0.5000 mg | INTRAMUSCULAR | Status: DC | PRN
  Administered 2016-03-04: 1 mg via INTRAVENOUS
  Administered 2016-03-04: 0.5 mg via INTRAVENOUS
  Administered 2016-03-05 – 2016-03-07 (×12): 1 mg via INTRAVENOUS
  Administered 2016-03-08: 0.5 mg via INTRAVENOUS
  Filled 2016-03-04 (×16): qty 1

## 2016-03-04 MED ORDER — CHLORHEXIDINE GLUCONATE 0.12 % MT SOLN
15.0000 mL | Freq: Two times a day (BID) | OROMUCOSAL | Status: DC
  Administered 2016-03-04 – 2016-03-10 (×12): 15 mL via OROMUCOSAL
  Filled 2016-03-04 (×12): qty 15

## 2016-03-04 MED ORDER — LIDOCAINE HCL 2 % EX GEL
1.0000 "application " | Freq: Once | CUTANEOUS | Status: AC
  Administered 2016-03-04: 1
  Filled 2016-03-04: qty 11

## 2016-03-04 MED ORDER — ENOXAPARIN SODIUM 60 MG/0.6ML ~~LOC~~ SOLN
60.0000 mg | SUBCUTANEOUS | Status: DC
  Administered 2016-03-04 – 2016-03-09 (×6): 60 mg via SUBCUTANEOUS
  Filled 2016-03-04 (×8): qty 0.6

## 2016-03-04 MED ORDER — HYDROMORPHONE HCL 1 MG/ML IJ SOLN
1.0000 mg | Freq: Once | INTRAMUSCULAR | Status: AC
  Administered 2016-03-04: 1 mg via INTRAVENOUS
  Filled 2016-03-04: qty 1

## 2016-03-04 MED ORDER — ONDANSETRON HCL 4 MG/2ML IJ SOLN
4.0000 mg | Freq: Once | INTRAMUSCULAR | Status: AC | PRN
  Administered 2016-03-04: 4 mg via INTRAVENOUS
  Filled 2016-03-04 (×2): qty 2

## 2016-03-04 MED ORDER — PANTOPRAZOLE SODIUM 40 MG IV SOLR
40.0000 mg | Freq: Two times a day (BID) | INTRAVENOUS | Status: DC
Start: 2016-03-04 — End: 2016-03-09
  Administered 2016-03-04 – 2016-03-08 (×10): 40 mg via INTRAVENOUS
  Filled 2016-03-04 (×12): qty 40

## 2016-03-04 MED ORDER — HYDROCORTISONE 1 % EX CREA
TOPICAL_CREAM | Freq: Four times a day (QID) | CUTANEOUS | Status: DC | PRN
  Administered 2016-03-05: 22:00:00 via TOPICAL
  Filled 2016-03-04: qty 28

## 2016-03-04 MED ORDER — POTASSIUM CHLORIDE IN NACL 20-0.9 MEQ/L-% IV SOLN
INTRAVENOUS | Status: DC
  Administered 2016-03-04 – 2016-03-08 (×11): via INTRAVENOUS
  Filled 2016-03-04 (×13): qty 1000

## 2016-03-04 MED ORDER — ONDANSETRON 4 MG PO TBDP: 4.0000 mg | ORAL_TABLET | Freq: Four times a day (QID) | ORAL | Status: DC | PRN

## 2016-03-04 MED ORDER — SODIUM CHLORIDE 0.9 % IV SOLN
INTRAVENOUS | Status: DC
  Administered 2016-03-04: 12:00:00 via INTRAVENOUS

## 2016-03-04 MED ORDER — ONDANSETRON HCL 4 MG/2ML IJ SOLN: 4.0000 mg | Freq: Four times a day (QID) | INTRAMUSCULAR | Status: DC | PRN

## 2016-03-04 MED ORDER — DIPHENHYDRAMINE HCL 25 MG PO CAPS
25.0000 mg | ORAL_CAPSULE | Freq: Four times a day (QID) | ORAL | Status: DC | PRN
  Administered 2016-03-09: 25 mg via ORAL
  Filled 2016-03-04: qty 1

## 2016-03-04 MED ORDER — HYDRALAZINE HCL 20 MG/ML IJ SOLN: 5.0000 mg | INTRAMUSCULAR | Status: DC | PRN

## 2016-03-04 NOTE — ED Notes (Signed)
Patient transported to CT 

## 2016-03-04 NOTE — H&P (Signed)
Natalie Silva is an 65 y.o. female.   PCP:  Parke Poisson, MD  Chief Complaint: nausea vomiting and abdominal pain.   HPI: Pt brought to the Ed by EMS with abdominal pain starting 7 PM last night.  She later started having emesis she has not been able to keep anything down since. Over night she could not sleep secondary to pain, and discomfort.  She is not able to eat or drink anything without vomiting.  She did have a normal BM yesterday evening but none since.  She has not had issues with constipation.  Her AODM is diet controlled.  This is her first obstruction., she reports she did not lose much weight with the gastric banding. She had the Band and most of her other surgeries in [place] or [place].     Work up in the ED shows she is afebrile, BP is up and she is tachycardic.  WBC is up some, glucose is up some and her K+ is down some.  She has protein in her urine.  CT scan shows:  There are moderate distended small bowel loops with fluid and air-fluid levels within proximal and mid small bowel with abrupt transition point in caliber of small bowel in left anterior pelvis axial image 65. Findings highly suspicious for small bowel obstruction probable due to adhesions. The terminal ileum is decompressed small caliber.  No pericecal inflammation. Normal appendix.  Trace perihepatic ascites. Trace free fluid noted within posterior pelvis.    Past Medical History  Diagnosis Date  Hypertension    . GERD (gastroesophageal reflux disease)  . Diabetes mellitus without complication  Diet controlled     Depression   Arthritis    knee  Back pain   Sleep apnea 2008   does  use cpap regularly  Hysterectomy/prior gastric band with minimal weight loss Hiatal hernia repair at time of cholecystectomy   BMI 39        Past Surgical History  Procedure Laterality Date  . Anterior fusion cervical spine  1998  . Cholecystectomy    . Tonsillectomy    . Hernia repair      hiatal hernia  .  Laparoscopic gastric banding    . Abdominal hysterectomy    . Anterior cervical decomp/discectomy fusion  12/23/2011    Procedure: ANTERIOR CERVICAL DECOMPRESSION/DISCECTOMY FUSION 1 LEVEL;  Surgeon: Earleen Newport, MD;  Location: [facility];  Service: Neurosurgery;  Laterality: N/A;  Cervical Three-four Anterior cervical decompression/diskectomy fusion    No family history on file. Social History:  reports that she has never smoked. She does not have any smokeless tobacco history on file. She reports that she does not drink alcohol or use illicit drugs. Tobacco:  None Drugs:  None ETOH:  Rare  Retired    Allergies:  Allergies  Allergen Reactions  . Iohexol Hives and Itching     Desc: TO TAKE BENADRYL 50 MG PO PRIOR TO ESI   . Contrast Media [Iodinated Diagnostic Agents] Hives, Itching and Rash     Prior to Admission medications   Medication Sig Start Date End Date Taking? Authorizing Provider  citalopram (CELEXA) 40 MG tablet Take 40 mg by mouth daily.   Yes Historical Provider, MD  meloxicam (MOBIC) 15 MG tablet Take 15 mg by mouth daily.   Yes Historical Provider, MD  Multiple Vitamin (MULITIVITAMIN WITH MINERALS) TABS Take 1 tablet by mouth daily.   Yes Historical Provider, MD  omeprazole (PRILOSEC) 40 MG capsule Take  40 mg by mouth daily. 12/16/15  Yes Historical Provider, MD  traMADol (ULTRAM) 50 MG tablet Take 50 mg by mouth every 6 (six) hours as needed. pain 02/11/16  Yes Historical Provider, MD  traZODone (DESYREL) 50 MG tablet Take 50 mg by mouth at bedtime as needed. sleep 02/11/16  Yes Historical Provider, MD  valsartan-hydrochlorothiazide (DIOVAN-HCT) 160-12.5 MG tablet Take 1 tablet by mouth daily. 02/27/15  Yes Historical Provider, MD     Results for orders placed or performed during the hospital encounter of 03/04/16 (from the past 48 hour(s))  CBC     Status: None   Collection Time: 03/04/16  4:26 AM  Result Value Ref Range   WBC  4.0 - 10.5 K/uL     QUESTIONABLE RESULTS, RECOMMEND RECOLLECT TO VERIFY    Comment: PLEASE DISREGARD RESULTS. CORRECTED ON 05/09 AT 5427: PREVIOUSLY REPORTED AS 3.4 QUESTIONABLE RESULTS, RECOMMEND RECOLLECT TO VERIFY    RBC  3.87 - 5.11 MIL/uL    QUESTIONABLE RESULTS, RECOMMEND RECOLLECT TO VERIFY    Comment: PLEASE DISREGARD RESULTS. CORRECTED ON 05/09 AT 0623: PREVIOUSLY REPORTED AS 1.74 QUESTIONABLE RESULTS, RECOMMEND RECOLLECT TO VERIFY    Hemoglobin  12.0 - 15.0 g/dL    QUESTIONABLE RESULTS, RECOMMEND RECOLLECT TO VERIFY    Comment: PLEASE DISREGARD RESULTS. CORRECTED ON 05/09 AT 7628: PREVIOUSLY REPORTED AS 4.6 REPEATED TO VERIFY CRITICAL RESULT CALLED TO, READ BACK BY AND VERIFIED WITH: J.OXENDINE,RN AT 0454 ON 03/04/16 BY W.SHEA QUESTIONABLE RESULTS, RECOMMEND RECOLLECT TO VERIFY    HCT  36.0 - 46.0 %    QUESTIONABLE RESULTS, RECOMMEND RECOLLECT TO VERIFY    Comment: PLEASE DISREGARD RESULTS. CORRECTED ON 05/09 AT 3151: PREVIOUSLY REPORTED AS 13.3 QUESTIONABLE RESULTS, RECOMMEND RECOLLECT TO VERIFY    MCV  78.0 - 100.0 fL    QUESTIONABLE RESULTS, RECOMMEND RECOLLECT TO VERIFY    Comment: PLEASE DISREGARD RESULTS. CORRECTED ON 05/09 AT 7616: PREVIOUSLY REPORTED AS 76.4 QUESTIONABLE RESULTS, RECOMMEND RECOLLECT TO VERIFY    MCH  26.0 - 34.0 pg    QUESTIONABLE RESULTS, RECOMMEND RECOLLECT TO VERIFY    Comment: PLEASE DISREGARD RESULTS. CORRECTED ON 05/09 AT 0737: PREVIOUSLY REPORTED AS 26.4 QUESTIONABLE RESULTS, RECOMMEND RECOLLECT TO VERIFY    MCHC  30.0 - 36.0 g/dL    QUESTIONABLE RESULTS, RECOMMEND RECOLLECT TO VERIFY    Comment: PLEASE DISREGARD RESULTS. CORRECTED ON 05/09 AT 1062: PREVIOUSLY REPORTED AS 34.6 QUESTIONABLE RESULTS, RECOMMEND RECOLLECT TO VERIFY    RDW  11.5 - 15.5 %    QUESTIONABLE RESULTS, RECOMMEND RECOLLECT TO VERIFY    Comment: PLEASE DISREGARD RESULTS. CORRECTED ON 05/09 AT 6948: PREVIOUSLY REPORTED AS 15.0 QUESTIONABLE RESULTS, RECOMMEND RECOLLECT TO VERIFY     Platelets  150 - 400 K/uL    QUESTIONABLE RESULTS, RECOMMEND RECOLLECT TO VERIFY    Comment: PLEASE DISREGARD RESULTS. CORRECTED ON 05/09 AT 5462: PREVIOUSLY REPORTED AS QUESTIONABLE RESULTS, RECOMMEND RECOLLECT TO VERIFY   CBC with Differential     Status: Abnormal   Collection Time: 03/04/16  5:29 AM  Result Value Ref Range   WBC 10.1 4.0 - 10.5 K/uL   RBC 5.61 (H) 3.87 - 5.11 MIL/uL   Hemoglobin 15.3 (H) 12.0 - 15.0 g/dL    Comment: DELTA CHECK NOTED RESULTS VERIFIED VIA RECOLLECT    HCT 42.0 36.0 - 46.0 %   MCV 74.9 (L) 78.0 - 100.0 fL   MCH 27.3 26.0 - 34.0 pg   MCHC 36.4 (H) 30.0 - 36.0 g/dL   RDW 14.8 11.5 - 15.5 %  Platelets 262 150 - 400 K/uL   Neutrophils Relative % 88 %   Lymphocytes Relative 7 %   Monocytes Relative 5 %   Eosinophils Relative 0 %   Basophils Relative 0 %   Neutro Abs 8.9 (H) 1.7 - 7.7 K/uL   Lymphs Abs 0.7 0.7 - 4.0 K/uL   Monocytes Absolute 0.5 0.1 - 1.0 K/uL   Eosinophils Absolute 0.0 0.0 - 0.7 K/uL   Basophils Absolute 0.0 0.0 - 0.1 K/uL   RBC Morphology TARGET CELLS   Comprehensive metabolic panel     Status: Abnormal   Collection Time: 03/04/16  5:29 AM  Result Value Ref Range   Sodium 138 135 - 145 mmol/L   Potassium 3.4 (L) 3.5 - 5.1 mmol/L   Chloride 100 (L) 101 - 111 mmol/L   CO2 23 22 - 32 mmol/L   Glucose, Bld 207 (H) 65 - 99 mg/dL   BUN 20 6 - 20 mg/dL   Creatinine, Ser 0.92 0.44 - 1.00 mg/dL   Calcium 10.0 8.9 - 10.3 mg/dL   Total Protein 8.4 (H) 6.5 - 8.1 g/dL   Albumin 4.7 3.5 - 5.0 g/dL   AST 24 15 - 41 U/L   ALT 20 14 - 54 U/L   Alkaline Phosphatase 60 38 - 126 U/L   Total Bilirubin 0.9 0.3 - 1.2 mg/dL   GFR calc non Af Amer >60 >60 mL/min   GFR calc Af Amer >60 >60 mL/min    Comment: (NOTE) The eGFR has been calculated using the CKD EPI equation. This calculation has not been validated in all clinical situations. eGFR's persistently <60 mL/min signify possible Chronic Kidney Disease.    Anion gap 15 5 - 15   Lipase, blood     Status: None   Collection Time: 03/04/16  5:29 AM  Result Value Ref Range   Lipase 22 11 - 51 U/L  Urinalysis, Routine w reflex microscopic     Status: Abnormal   Collection Time: 03/04/16  7:31 AM  Result Value Ref Range   Color, Urine AMBER (A) YELLOW    Comment: BIOCHEMICALS MAY BE AFFECTED BY COLOR   APPearance CLEAR CLEAR   Specific Gravity, Urine 1.025 1.005 - 1.030   pH 5.5 5.0 - 8.0   Glucose, UA NEGATIVE NEGATIVE mg/dL   Hgb urine dipstick TRACE (A) NEGATIVE   Bilirubin Urine SMALL (A) NEGATIVE   Ketones, ur 40 (A) NEGATIVE mg/dL   Protein, ur >300 (A) NEGATIVE mg/dL   Nitrite NEGATIVE NEGATIVE   Leukocytes, UA NEGATIVE NEGATIVE  Urine microscopic-add on     Status: Abnormal   Collection Time: 03/04/16  7:31 AM  Result Value Ref Range   Squamous Epithelial / LPF 0-5 (A) NONE SEEN   WBC, UA 0-5 0 - 5 WBC/hpf   RBC / HPF 0-5 0 - 5 RBC/hpf   Bacteria, UA FEW (A) NONE SEEN   Casts HYALINE CASTS (A) NEGATIVE   Urine-Other MUCOUS PRESENT   CBC with Differential/Platelet     Status: Abnormal   Collection Time: 03/04/16  8:48 AM  Result Value Ref Range   WBC 11.3 (H) 4.0 - 10.5 K/uL   RBC 5.19 (H) 3.87 - 5.11 MIL/uL   Hemoglobin 14.0 12.0 - 15.0 g/dL   HCT 38.5 36.0 - 46.0 %   MCV 74.2 (L) 78.0 - 100.0 fL   MCH 27.0 26.0 - 34.0 pg   MCHC 36.4 (H) 30.0 - 36.0 g/dL   RDW 14.7 11.5 -  15.5 %   Platelets 254 150 - 400 K/uL   Neutrophils Relative % 86 %   Lymphocytes Relative 7 %   Monocytes Relative 7 %   Eosinophils Relative 0 %   Basophils Relative 0 %   Neutro Abs 9.7 (H) 1.7 - 7.7 K/uL   Lymphs Abs 0.8 0.7 - 4.0 K/uL   Monocytes Absolute 0.8 0.1 - 1.0 K/uL   Eosinophils Absolute 0.0 0.0 - 0.7 K/uL   Basophils Absolute 0.0 0.0 - 0.1 K/uL   RBC Morphology TARGET CELLS    Ct Abdomen Pelvis Wo Contrast  03/04/2016  CLINICAL DATA:  Sharp abdominal pain starting last night, 2 episodes of vomiting, nausea EXAM: CT ABDOMEN AND PELVIS WITHOUT CONTRAST  TECHNIQUE: Multidetector CT imaging of the abdomen and pelvis was performed following the standard protocol without IV contrast. COMPARISON:  None FINDINGS: Lower chest: Small hiatal hernia is noted. There is 6 mm nodule in left lower lobe posteromedially. 7 mm nodule is noted in left lower lobe laterally. There is subpleural 4 mm nodule in left lower lobe posteriorly. Hepatobiliary: The study is limited without IV contrast. The patient is status postcholecystectomy. Surgical clips are noted just medial to mid stomach. No intrahepatic biliary ductal dilatation. Trace perihepatic ascites. Pancreas: Unenhanced pancreas is unremarkable. Spleen: Unenhanced spleen is unremarkable. Adrenals/Urinary Tract: No adrenal gland mass. Unenhanced kidneys are symmetrical in size. No hydronephrosis or hydroureter. No nephrolithiasis. No calcified ureteral calculi. Stomach/Bowel: There is no gastric outlet obstruction. Multiple fluid distended small bowel loops are noted in upper and lower abdomen. Air-fluid levels are noted in lower small bowel. Axial image 69 there is abrupt transition point in caliber of small bowel highly suspicious for small bowel obstruction. The terminal ileum is small caliber decompressed. Some residual stool noted within cecum. Normal appendix. No pericecal inflammation. Minimal stranding of mesenteric fat within lower pelvis. No definite mesenteric fluid collection. The left colon and sigmoid colon is empty partially collapsed. Vascular/Lymphatic: No retroperitoneal or mesenteric adenopathy. No aortic aneurysm. Reproductive: The patient is status post hysterectomy. Other: There is no evidence of free abdominal air. Trace posterior pelvic free fluid. No inguinal adenopathy. Musculoskeletal: No destructive bony lesions are noted. Mild degenerative changes thoracic and lumbar spine. No destructive bony lesions are noted within pelvis. Degenerative changes bilateral SI joints. IMPRESSION: 1. There are moderate  distended small bowel loops with fluid and air-fluid levels within proximal and mid small bowel with abrupt transition point in caliber of small bowel in left anterior pelvis axial image 65. Findings highly suspicious for small bowel obstruction probable due to adhesions. The terminal ileum is decompressed small caliber. 2. No pericecal inflammation.  Normal appendix. 3. Trace perihepatic ascites. Trace free fluid noted within posterior pelvis. 4. Scattered lung nodules are noted in left lower lobe the largest measures 7 mm. Non-contrast chest CT at 3-6 months is recommended. If the nodules are stable at time of repeat CT, then future CT at 18-24 months (from today's scan) is considered optional for low-risk patients, but is recommended for high-risk patients. This recommendation follows the consensus statement: Guidelines for Management of Incidental Pulmonary Nodules Detected on CT Images:From the Fleischner Society 2017; published online before print (10.1148/radiol.[id number]). 5. Status postcholecystectomy.  Status post hysterectomy. 6. Mild degenerative changes thoracolumbar spine. Electronically Signed   By: Lahoma Crocker M.D.   On: 03/04/2016 10:34    Review of Systems  Constitutional: Positive for chills. Negative for fever, weight loss, malaise/fatigue and diaphoresis.  Eyes: Negative.  Respiratory: Negative.   Cardiovascular: Negative.   Gastrointestinal: Positive for heartburn, nausea, vomiting and abdominal pain. Negative for diarrhea, constipation, blood in stool and melena.       Normal BM last evening   Genitourinary: Negative.   Musculoskeletal: Positive for back pain, joint pain and neck pain.  Skin:       Bees sting LLE with some erythema and itching.  Neurological: Negative.  Negative for weakness.  Endo/Heme/Allergies: Negative.   Psychiatric/Behavioral: Positive for depression.    Blood pressure 160/90, pulse 81, temperature 97.3 F (36.3 C), temperature source Oral, resp.  rate 16, SpO2 94 %. Physical Exam  Constitutional: She is oriented to person, place, and time. She appears well-developed and well-nourished.  BMI 39  HENT:  Head: Normocephalic and atraumatic.  Nose: Nose normal.  Mouth/Throat: No oropharyngeal exudate.  Eyes: Conjunctivae are normal. Right eye exhibits no discharge. Left eye exhibits no discharge. No scleral icterus.  Neck: Neck supple. No JVD present. No tracheal deviation present. No thyromegaly present.  Cardiovascular: Normal rate, regular rhythm, normal heart sounds and intact distal pulses.   No murmur heard. Respiratory: Effort normal and breath sounds normal. No respiratory distress. She has no wheezes. She has no rales. She exhibits no tenderness.  GI: Soft. She exhibits distension (unsure very large abdomen.  she says it is much softer than last PM when she got sick.). She exhibits no mass. There is no tenderness. There is no rebound and no guarding.  Few BS, not very active borderline high pitch She has scars from gastric band upper abdomen and scars lower abdomen from hysterectomy  Musculoskeletal: She exhibits no edema.  Lymphadenopathy:    She has no cervical adenopathy.  Neurological: She is alert and oriented to person, place, and time. No cranial nerve deficit.  Skin: Skin is warm and dry. No rash noted. No erythema. No pallor.  She has a hemangioma Left foot,  Bee sting left leg, medial aspect just below the Knee. + for erythema and pruritis .    Psychiatric: She has a normal mood and affect. Her behavior is normal. Judgment and thought content normal.     Assessment/Plan SBO new, hx of gastric band 2007-2014, removed Hypertension  AODM- diet controlled Hx of neck and back pain Sleep apnea she does use CPAP BMI 39 Arthritis in her knees's  Plan:  NG decompression, IV fluids and bowel rest.  Confirm NG placement and recheck film in AM.  Hydralazine for BP for now, recheck labs and A1C  In  AM.   Makenize Messman, PA-C 03/04/2016, 12:08 PM

## 2016-03-04 NOTE — ED Notes (Signed)
MD at bedside. 

## 2016-03-04 NOTE — Progress Notes (Signed)
ANTICOAGULATION CONSULT NOTE - Initial Consult  Pharmacy Consult for enoxaparin Indication: VTE prophylaxis  Allergies  Allergen Reactions  . Iohexol Hives and Itching     Desc: TO TAKE BENADRYL 50 MG PO PRIOR TO ESI   . Contrast Media [Iodinated Diagnostic Agents] Hives, Itching and Rash    Patient Measurements: Height: 5\' 8"  (172.7 cm) Weight: 260 lb (117.935 kg) IBW/kg (Calculated) : 63.9 Heparin Dosing Weight:   Vital Signs: Temp: 97.3 F (36.3 C) (05/09 0618) Temp Source: Oral (05/09 0618) BP: 127/76 mmHg (05/09 1330) Pulse Rate: 90 (05/09 1330)  Labs:  Recent Labs  03/04/16 0426 03/04/16 0529 03/04/16 0848  HGB QUESTIONABLE RESULTS, RECOMMEND RECOLLECT TO VERIFY 15.3* 14.0  HCT QUESTIONABLE RESULTS, RECOMMEND RECOLLECT TO VERIFY 42.0 38.5  PLT QUESTIONABLE RESULTS, RECOMMEND RECOLLECT TO VERIFY 262 254  CREATININE  --  0.92  --     Estimated Creatinine Clearance: 82.3 mL/min (by C-G formula based on Cr of 0.92).   Medical History: Past Medical History  Diagnosis Date  . Hypertension   . Neck pain on right side   . Depression   . Arthritis     knee  . Back pain   . Sleep apnea 2008    does not use cpap regularly  . H/O diabetes mellitus   . GERD (gastroesophageal reflux disease)   . Diabetes mellitus without complication (HCC)     Medications:  Scheduled:  . enoxaparin (LOVENOX) injection  60 mg Subcutaneous Q24H  . pantoprazole (PROTONIX) IV  40 mg Intravenous Q12H    Assessment: Pharmacy is consulted to dose enoxaparin in 65 yo female for VTE prophylaxis. Pt is admitted with a SBO. Pt has a PMH of gastric lap band from 2007 to 2014, with subsequent removal.   On 03/04/16. Hgb is 14, Hct 38.5, plt 254, SCr0/92 mg/dl.     Goal of Therapy:  Anti-Xa level 0.6-1 units/ml 4hrs after LMWH dose given Monitor platelets by anticoagulation protocol: Yes   Plan:  Lovenox 60 mg [place] q24h  ( 0.5 mg/kg) based on BMI of 39 and CrCl greater than 30 ml/min.     Adalberto ColeNikola Ameliyah Sarno, PharmD, BCPS Pager [phone] 03/04/2016 2:25 PM

## 2016-03-04 NOTE — ED Notes (Signed)
Pt can go to floor at 14:05 

## 2016-03-04 NOTE — ED Notes (Signed)
Radiology at bedside

## 2016-03-04 NOTE — ED Notes (Addendum)
NG tube advanced 10 cm due to radiology notification of xray results.

## 2016-03-04 NOTE — ED Notes (Signed)
Requested patient to urinate. 

## 2016-03-04 NOTE — Progress Notes (Signed)
Patient is unable to wear the CPAP due to NG Tube in place at this time. She said she will try tomorrow if the Tube is removed.

## 2016-03-04 NOTE — ED Notes (Signed)
Pt brought in by EMS from home with c/o abd pain that started around 7pm last night  Pt describes pain as sharp, stabbing and constant  Pt states she had two episodes of vomiting the last being around midnight  Pt denies diarrhea, dizziness, or nausea in route but upon arrival to the hospital is c/o nausea  EMS states pt was diaphoretic upon their arrival  EKG showed NSR  IV was established by EMS and NS is infusing

## 2016-03-05 ENCOUNTER — Inpatient Hospital Stay (HOSPITAL_COMMUNITY): Payer: Medicare Other

## 2016-03-05 LAB — CBC
HCT: 35.5 % — ABNORMAL LOW (ref 36.0–46.0)
HEMOGLOBIN: 12.6 g/dL (ref 12.0–15.0)
MCH: 26.7 pg (ref 26.0–34.0)
MCHC: 35.5 g/dL (ref 30.0–36.0)
MCV: 75.2 fL — AB (ref 78.0–100.0)
PLATELETS: 246 10*3/uL (ref 150–400)
RBC: 4.72 MIL/uL (ref 3.87–5.11)
RDW: 15 % (ref 11.5–15.5)
WBC: 4.9 10*3/uL (ref 4.0–10.5)

## 2016-03-05 LAB — BASIC METABOLIC PANEL
Anion gap: 8 (ref 5–15)
BUN: 18 mg/dL (ref 6–20)
CALCIUM: 8.5 mg/dL — AB (ref 8.9–10.3)
CHLORIDE: 110 mmol/L (ref 101–111)
CO2: 26 mmol/L (ref 22–32)
CREATININE: 0.86 mg/dL (ref 0.44–1.00)
GFR calc Af Amer: >60 mL/min (ref >60)
GFR calc non Af Amer: >60 mL/min (ref >60)
GLUCOSE: 139 mg/dL — AB (ref 65–99)
Potassium: 3.9 mmol/L (ref 3.5–5.1)
Sodium: 144 mmol/L (ref 135–145)

## 2016-03-05 MED ORDER — LIP MEDEX EX OINT
TOPICAL_OINTMENT | CUTANEOUS | Status: AC
  Administered 2016-03-05: 09:00:00
  Filled 2016-03-05: qty 7

## 2016-03-05 NOTE — Progress Notes (Signed)
Subjective: I do not think her NG is working it was advanced last PM, but I can't get the sump to allow air down it, the main portion will flush but nothing coming back with irrigation.  No flatus, No BM, no real clinical change.  Objective: Vital signs in last 24 hours: Temp:  [98.6 F (37 C)-99.5 F (37.5 C)] 99.5 F (37.5 C) (05/10 4098) Pulse Rate:  [77-94] 94 (05/10 0638) Resp:  [16-20] 19 (05/10 0638) BP: (127-160)/(70-96) 145/70 mmHg (05/10 0638) SpO2:  [92 %-96 %] 94 % (05/10 1191) Weight:  [117.935 kg (260 lb)] 117.935 kg (260 lb) (05/09 1357) Last BM Date: 03/03/16 175 from the NG 1500 IV fluids 216 urine recorded Afebrile, TM 99.5,  Labs OK NG yesterday distal esophageous Intake/Output from previous day: 05/09 0701 - 05/10 0700 In: 1500 [I.V.:1500] Out: 591 [Urine:216; Emesis/NG output:175] Intake/Output this shift: Total I/O In: -  Out: 200 [Urine:200]  General appearance: alert, cooperative, no distress and no real improvement GI: soft, large abdomen rare BS, no flatus or BM.  Not as uncomfortable as she was on admission.  Lab Results:   Recent Labs  03/04/16 0848 03/05/16 0437  WBC 11.3* 4.9  HGB 14.0 12.6  HCT 38.5 35.5*  PLT 254 246    BMET  Recent Labs  03/04/16 0529 03/05/16 0437  NA 138 144  K 3.4* 3.9  CL 100* 110  CO2 23 26  GLUCOSE 207* 139*  BUN 20 18  CREATININE 0.92 0.86  CALCIUM 10.0 8.5*   PT/INR No results for input(s): LABPROT, INR in the last 72 hours.   Recent Labs Lab 03/04/16 0529  AST 24  ALT 20  ALKPHOS 60  BILITOT 0.9  PROT 8.4*  ALBUMIN 4.7     Lipase     Component Value Date/Time   LIPASE 22 03/04/2016 0529     Studies/Results: Ct Abdomen Pelvis Wo Contrast  03/04/2016  CLINICAL DATA:  Sharp abdominal pain starting last night, 2 episodes of vomiting, nausea EXAM: CT ABDOMEN AND PELVIS WITHOUT CONTRAST TECHNIQUE: Multidetector CT imaging of the abdomen and pelvis was performed following the  standard protocol without IV contrast. COMPARISON:  None FINDINGS: Lower chest: Small hiatal hernia is noted. There is 6 mm nodule in left lower lobe posteromedially. 7 mm nodule is noted in left lower lobe laterally. There is subpleural 4 mm nodule in left lower lobe posteriorly. Hepatobiliary: The study is limited without IV contrast. The patient is status postcholecystectomy. Surgical clips are noted just medial to mid stomach. No intrahepatic biliary ductal dilatation. Trace perihepatic ascites. Pancreas: Unenhanced pancreas is unremarkable. Spleen: Unenhanced spleen is unremarkable. Adrenals/Urinary Tract: No adrenal gland mass. Unenhanced kidneys are symmetrical in size. No hydronephrosis or hydroureter. No nephrolithiasis. No calcified ureteral calculi. Stomach/Bowel: There is no gastric outlet obstruction. Multiple fluid distended small bowel loops are noted in upper and lower abdomen. Air-fluid levels are noted in lower small bowel. Axial image 69 there is abrupt transition point in caliber of small bowel highly suspicious for small bowel obstruction. The terminal ileum is small caliber decompressed. Some residual stool noted within cecum. Normal appendix. No pericecal inflammation. Minimal stranding of mesenteric fat within lower pelvis. No definite mesenteric fluid collection. The left colon and sigmoid colon is empty partially collapsed. Vascular/Lymphatic: No retroperitoneal or mesenteric adenopathy. No aortic aneurysm. Reproductive: The patient is status post hysterectomy. Other: There is no evidence of free abdominal air. Trace posterior pelvic free fluid. No inguinal adenopathy. Musculoskeletal: No destructive  bony lesions are noted. Mild degenerative changes thoracic and lumbar spine. No destructive bony lesions are noted within pelvis. Degenerative changes bilateral SI joints. IMPRESSION: 1. There are moderate distended small bowel loops with fluid and air-fluid levels within proximal and mid small  bowel with abrupt transition point in caliber of small bowel in left anterior pelvis axial image 65. Findings highly suspicious for small bowel obstruction probable due to adhesions. The terminal ileum is decompressed small caliber. 2. No pericecal inflammation.  Normal appendix. 3. Trace perihepatic ascites. Trace free fluid noted within posterior pelvis. 4. Scattered lung nodules are noted in left lower lobe the largest measures 7 mm. Non-contrast chest CT at 3-6 months is recommended. If the nodules are stable at time of repeat CT, then future CT at 18-24 months (from today's scan) is considered optional for low-risk patients, but is recommended for high-risk patients. This recommendation follows the consensus statement: Guidelines for Management of Incidental Pulmonary Nodules Detected on CT Images:From the Fleischner Society 2017; published online before print (10.1148/[id number]). 5. Status postcholecystectomy.  Status post hysterectomy. 6. Mild degenerative changes thoracolumbar spine. Electronically Signed   By: Natasha MeadLiviu  Pop M.D.   On: 03/04/2016 10:34   Dg Abd 1 View  03/04/2016  CLINICAL DATA:  Abdominal pain and vomiting. Small bowel obstruction. EXAM: ABDOMEN - 1 VIEW COMPARISON:  CT from earlier today FINDINGS: Nasogastric tube tip overlaps the lower esophagus. Leftward deviation of the lower catheter correlates with tortuosity of the lower esophagus on preceding CT. Need at least 10 cm advancement. There is dilated small bowel correlating with history of small bowel obstruction. Surgical clips around the proximal stomach. These results will be called to the ordering clinician or representative by the Radiologist Assistant, and communication documented in the PACS or zVision Dashboard. IMPRESSION: 1. Nasogastric tube tip at the distal esophagus. 2. Known small bowel obstruction. Electronically Signed   By: Marnee SpringJonathon  Watts M.D.   On: 03/04/2016 14:00    Medications: . antiseptic oral rinse  7  mL Mouth Rinse q12n4p  . chlorhexidine  15 mL Mouth Rinse BID  . enoxaparin (LOVENOX) injection  60 mg Subcutaneous Q24H  . lip balm      . pantoprazole (PROTONIX) IV  40 mg Intravenous Q12H   . 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  Continue nPO, bowel rest and fluids.  NG is coiled in the esophageus.  We will have nursing staff pull old tube and replace with a new one.  .    LOS: 1 day    Chun Sellen 03/05/2016 [id number]

## 2016-03-05 NOTE — Progress Notes (Signed)
Orders from Will, PA to advance NGT.  Explained to pt procedure and reason.  Pt agreeable.  Advanced NGT about 2 inches.  Pt's face began to hurt, 8/10 pain.  Pain medication given.  Will continue to monitor.  Sherron MondayGood, Cloris Flippo L

## 2016-03-05 NOTE — Progress Notes (Signed)
Pt has refused CPAP for the night due to NG tube in place.  RT to monitor and assess as needed.  

## 2016-03-06 ENCOUNTER — Inpatient Hospital Stay (HOSPITAL_COMMUNITY): Payer: Medicare Other

## 2016-03-06 ENCOUNTER — Ambulatory Visit: Payer: Self-pay

## 2016-03-06 LAB — HEMOGLOBIN A1C
HEMOGLOBIN A1C: 6.5 % — AB (ref 4.8–5.6)
Mean Plasma Glucose: 140 mg/dL

## 2016-03-06 NOTE — Progress Notes (Signed)
Patient refuses CPAP at this time. Patient feels that since she has an NG tube that it would be too uncomfortable for her to wear. RT will continue to monitor.

## 2016-03-06 NOTE — Progress Notes (Signed)
Subjective: She is up walking some and feels some better.  Less distension.  She feels beat up the the NG issues and her nose is running .    Objective: Vital signs in last 24 hours: Temp:  [97.7 F (36.5 C)-98.6 F (37 C)] 98.6 F (37 C) (05/11 0603) Pulse Rate:  [70-90] 79 (05/11 0603) Resp:  [17-18] 18 (05/11 0603) BP: (147-152)/(65-86) 150/79 mmHg (05/11 0603) SpO2:  [92 %-96 %] 95 % (05/11 0603) Last BM Date: 03/03/16 1500 IV NG 1100 yesterday Stools none Afebrile, VSS Labs OK this AM Intake/Output from previous day: 05/10 0701 - 05/11 0700 In: 1500 [I.V.:1500] Out: 2100 [Urine:1000; Emesis/NG output:1100] Intake/Output this shift:    General appearance: alert, cooperative and no distress Resp: clear to auscultation bilaterally GI: soft, feels less distended, + BS and some flatus.    Lab Results:   Recent Labs  03/04/16 0848 03/05/16 0437  WBC 11.3* 4.9  HGB 14.0 12.6  HCT 38.5 35.5*  PLT 254 246    BMET  Recent Labs  03/04/16 0529 03/05/16 0437  NA 138 144  K 3.4* 3.9  CL 100* 110  CO2 23 26  GLUCOSE 207* 139*  BUN 20 18  CREATININE 0.92 0.86  CALCIUM 10.0 8.5*   PT/INR No results for input(s): LABPROT, INR in the last 72 hours.   Recent Labs Lab 03/04/16 0529  AST 24  ALT 20  ALKPHOS 60  BILITOT 0.9  PROT 8.4*  ALBUMIN 4.7     Lipase     Component Value Date/Time   LIPASE 22 03/04/2016 0529     Studies/Results: Ct Abdomen Pelvis Wo Contrast  03/04/2016  CLINICAL DATA:  Sharp abdominal pain starting last night, 2 episodes of vomiting, nausea EXAM: CT ABDOMEN AND PELVIS WITHOUT CONTRAST TECHNIQUE: Multidetector CT imaging of the abdomen and pelvis was performed following the standard protocol without IV contrast. COMPARISON:  None FINDINGS: Lower chest: Small hiatal hernia is noted. There is 6 mm nodule in left lower lobe posteromedially. 7 mm nodule is noted in left lower lobe laterally. There is subpleural 4 mm nodule in left  lower lobe posteriorly. Hepatobiliary: The study is limited without IV contrast. The patient is status postcholecystectomy. Surgical clips are noted just medial to mid stomach. No intrahepatic biliary ductal dilatation. Trace perihepatic ascites. Pancreas: Unenhanced pancreas is unremarkable. Spleen: Unenhanced spleen is unremarkable. Adrenals/Urinary Tract: No adrenal gland mass. Unenhanced kidneys are symmetrical in size. No hydronephrosis or hydroureter. No nephrolithiasis. No calcified ureteral calculi. Stomach/Bowel: There is no gastric outlet obstruction. Multiple fluid distended small bowel loops are noted in upper and lower abdomen. Air-fluid levels are noted in lower small bowel. Axial image 69 there is abrupt transition point in caliber of small bowel highly suspicious for small bowel obstruction. The terminal ileum is small caliber decompressed. Some residual stool noted within cecum. Normal appendix. No pericecal inflammation. Minimal stranding of mesenteric fat within lower pelvis. No definite mesenteric fluid collection. The left colon and sigmoid colon is empty partially collapsed. Vascular/Lymphatic: No retroperitoneal or mesenteric adenopathy. No aortic aneurysm. Reproductive: The patient is status post hysterectomy. Other: There is no evidence of free abdominal air. Trace posterior pelvic free fluid. No inguinal adenopathy. Musculoskeletal: No destructive bony lesions are noted. Mild degenerative changes thoracic and lumbar spine. No destructive bony lesions are noted within pelvis. Degenerative changes bilateral SI joints. IMPRESSION: 1. There are moderate distended small bowel loops with fluid and air-fluid levels within proximal and mid small bowel with  abrupt transition point in caliber of small bowel in left anterior pelvis axial image 65. Findings highly suspicious for small bowel obstruction probable due to adhesions. The terminal ileum is decompressed small caliber. 2. No pericecal  inflammation.  Normal appendix. 3. Trace perihepatic ascites. Trace free fluid noted within posterior pelvis. 4. Scattered lung nodules are noted in left lower lobe the largest measures 7 mm. Non-contrast chest CT at 3-6 months is recommended. If the nodules are stable at time of repeat CT, then future CT at 18-24 months (from today's scan) is considered optional for low-risk patients, but is recommended for high-risk patients. This recommendation follows the consensus statement: Guidelines for Management of Incidental Pulmonary Nodules Detected on CT Images:From the Fleischner Society 2017; published online before print (10.1148/[id number]). 5. Status postcholecystectomy.  Status post hysterectomy. 6. Mild degenerative changes thoracolumbar spine. Electronically Signed   By: Natasha MeadLiviu  Pop M.D.   On: 03/04/2016 10:34   Dg Abd 1 View  03/05/2016  CLINICAL DATA:  Encounter for nasogastric tube placement. EXAM: ABDOMEN - 1 VIEW COMPARISON:  Same day. FINDINGS: Mildly dilated small bowel loops are noted concerning for distal small bowel obstruction. Nasogastric tube tip is seen in the region of the gastroesophageal junction. IMPRESSION: Mildly dilated small bowel loops concerning for distal small bowel obstruction. Nasogastric tube tip seen in region of gastroesophageal junction. Electronically Signed   By: Lupita RaiderJames  Green Jr, M.D.   On: 03/05/2016 13:32   Dg Abd 1 View  03/04/2016  CLINICAL DATA:  Abdominal pain and vomiting. Small bowel obstruction. EXAM: ABDOMEN - 1 VIEW COMPARISON:  CT from earlier today FINDINGS: Nasogastric tube tip overlaps the lower esophagus. Leftward deviation of the lower catheter correlates with tortuosity of the lower esophagus on preceding CT. Need at least 10 cm advancement. There is dilated small bowel correlating with history of small bowel obstruction. Surgical clips around the proximal stomach. These results will be called to the ordering clinician or representative by the  Radiologist Assistant, and communication documented in the PACS or zVision Dashboard. IMPRESSION: 1. Nasogastric tube tip at the distal esophagus. 2. Known small bowel obstruction. Electronically Signed   By: Marnee SpringJonathon  Watts M.D.   On: 03/04/2016 14:00   Dg Abd 2 Views  03/05/2016  CLINICAL DATA:  Check nasogastric catheter placement EXAM: ABDOMEN - 2 VIEW COMPARISON:  03/04/2016 FINDINGS: Nasogastric catheter is again noted coiled upon itself in the distal esophagus. Stable changes of small-bowel obstruction are noted. No free air is seen. IMPRESSION: Nasogastric catheter coiled within the distal esophagus. Persistent small bowel obstruction is noted. Electronically Signed   By: Alcide CleverMark  Lukens M.D.   On: 03/05/2016 10:47    Medications: . antiseptic oral rinse  7 mL Mouth Rinse q12n4p  . chlorhexidine  15 mL Mouth Rinse BID  . enoxaparin (LOVENOX) injection  60 mg Subcutaneous Q24H  . pantoprazole (PROTONIX) IV  40 mg Intravenous Q12H   . 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  Film is pending    LOS: 2 days    Natalie Silva 03/06/2016 [phone]

## 2016-03-07 ENCOUNTER — Inpatient Hospital Stay (HOSPITAL_COMMUNITY): Payer: Medicare Other

## 2016-03-07 NOTE — Care Management Important Message (Signed)
Important Message  Patient Details  Name: Natalie Silva MRN: [id number] Date of Birth: 05-17-1951   Medicare Important Message Given:  Yes    Haskell FlirtJamison, Barri Neidlinger 03/07/2016, 10:52 AMImportant Message  Patient Details  Name: Natalie Silva MRN: [id number] Date of Birth: 05-17-1951   Medicare Important Message Given:  Yes    Haskell FlirtJamison, Charnika Herbst 03/07/2016, 10:52 AM

## 2016-03-07 NOTE — Progress Notes (Signed)
Pt refuses CPAP at this time due to NG tube in place.  RT to monitor and assess as needed.

## 2016-03-07 NOTE — Progress Notes (Addendum)
ANTICOAGULATION CONSULT NOTE - Initial Consult  Pharmacy Consult for enoxaparin Indication: VTE prophylaxis  Allergies  Allergen Reactions  . Iohexol Hives and Itching     Desc: TO TAKE BENADRYL 50 MG PO PRIOR TO ESI   . Contrast Media [Iodinated Diagnostic Agents] Hives, Itching and Rash    Patient Measurements: Height: 5\' 8"  (172.7 cm) Weight: 260 lb (117.935 kg) IBW/kg (Calculated) : 63.9  Vital Signs: Temp: 99.1 F (37.3 C) (05/12 0524) Temp Source: Oral (05/12 0524) BP: 160/82 mmHg (05/12 0524) Pulse Rate: 81 (05/12 0524)  Labs:  Recent Labs  03/05/16 0437  HGB 12.6  HCT 35.5*  PLT 246  CREATININE 0.86    Estimated Creatinine Clearance: 88 mL/min (by C-G formula based on Cr of 0.86).   Medical History: Past Medical History  Diagnosis Date  . Hypertension   . Neck pain on right side   . Depression   . Arthritis     knee  . Back pain   . Sleep apnea 2008    does not use cpap regularly  . H/O diabetes mellitus   . GERD (gastroesophageal reflux disease)   . Diabetes mellitus without complication (HCC)     Medications:  Scheduled:  . antiseptic oral rinse  7 mL Mouth Rinse q12n4p  . chlorhexidine  15 mL Mouth Rinse BID  . enoxaparin (LOVENOX) injection  60 mg Subcutaneous Q24H  . pantoprazole (PROTONIX) IV  40 mg Intravenous Q12H    Assessment: Pharmacy is consulted to dose enoxaparin in 65 yo female for VTE prophylaxis. Pt is admitted with a SBO. Pt has a PMH of gastric lap band from 2007 to 2014, with subsequent removal.   Last labs (5/10) H/H down as of but unalarming; Plt wnl SCr stable wnl  Goal of Therapy:  Prevention of VTE  Plan:   Continue Lovenox 60 mg [place] q24h  (0.5 mg/kg) based on BMI of 39 and CrCl greater than 30 ml/min  CBC tomorrow  Will sign off on consult but follow peripherally (d/t dose > 40 mg)   Bernadene Personrew Lorin Hauck, PharmD, BCPS Pager: [phone] 03/07/2016, 11:03 AM

## 2016-03-07 NOTE — Progress Notes (Signed)
Subjective: She feels better, no flatus, film looks better, with air in colon and SB.  Dilatation is much improved.  Objective: Vital signs in last 24 hours: Temp:  [98.6 F (37 C)-99.1 F (37.3 C)] 99.1 F (37.3 C) (05/12 0524) Pulse Rate:  [78-81] 81 (05/12 0524) Resp:  [18] 18 (05/12 0524) BP: (157-160)/(82-92) 160/82 mmHg (05/12 0524) SpO2:  [91 %-96 %] 96 % (05/12 0524) Last BM Date: 03/03/16 1450 from the NG PO not recorded Afebrile, BP up some NO labs Intake/Output from previous day: 05/11 0701 - 05/12 0700 In: 1973.3 [I.V.:1973.3] Out: 3300 [Urine:1850; Emesis/NG output:1450] Intake/Output this shift:    General appearance: alert, cooperative and no distress Resp: clear to auscultation bilaterally GI: soft, much less distended and more comfortable, BS still hyperactive.  No flatus or BM.  Lab Results:   Recent Labs  03/04/16 0848 03/05/16 0437  WBC 11.3* 4.9  HGB 14.0 12.6  HCT 38.5 35.5*  PLT 254 246    BMET  Recent Labs  03/05/16 0437  NA 144  K 3.9  CL 110  CO2 26  GLUCOSE 139*  BUN 18  CREATININE 0.86  CALCIUM 8.5*   PT/INR No results for input(s): LABPROT, INR in the last 72 hours.   Recent Labs Lab 03/04/16 0529  AST 24  ALT 20  ALKPHOS 60  BILITOT 0.9  PROT 8.4*  ALBUMIN 4.7     Lipase     Component Value Date/Time   LIPASE 22 03/04/2016 0529     Studies/Results: Dg Abd 1 View  03/06/2016  CLINICAL DATA:  65 year old female with recent small bowel obstruction. Initial encounter. EXAM: ABDOMEN - 1 VIEW COMPARISON:  03/05/2016 and earlier, CT Abdomen and Pelvis 03/04/2016 FINDINGS: Portable AP supine view at 0901 hours. Enteric tube in place in his been advanced, the side hole now projects at the level of the gastric fundus. Decreased but not resolved gaseous distension of small bowel in the mid abdomen. Residual gas-filled small bowel loops now measure at the upper limits of normal to mildly increased at 33 mm diameter.  Similar volume of large bowel gas. No definite pneumoperitoneum on this supine view. Stable abdominal visceral contours. Stable visualized osseous structures. IMPRESSION: 1. Enteric tube has been advanced, side hole now at the level of the gastric fundus. 2. Significantly improved and nearly resolved small bowel obstruction gas pattern. Electronically Signed   By: Odessa Fleming M.D.   On: 03/06/2016 10:18   Dg Abd 1 View  03/05/2016  CLINICAL DATA:  Encounter for nasogastric tube placement. EXAM: ABDOMEN - 1 VIEW COMPARISON:  Same day. FINDINGS: Mildly dilated small bowel loops are noted concerning for distal small bowel obstruction. Nasogastric tube tip is seen in the region of the gastroesophageal junction. IMPRESSION: Mildly dilated small bowel loops concerning for distal small bowel obstruction. Nasogastric tube tip seen in region of gastroesophageal junction. Electronically Signed   By: Lupita Raider, M.D.   On: 03/05/2016 13:32   Dg Abd 2 Views  03/05/2016  CLINICAL DATA:  Check nasogastric catheter placement EXAM: ABDOMEN - 2 VIEW COMPARISON:  03/04/2016 FINDINGS: Nasogastric catheter is again noted coiled upon itself in the distal esophagus. Stable changes of small-bowel obstruction are noted. No free air is seen. IMPRESSION: Nasogastric catheter coiled within the distal esophagus. Persistent small bowel obstruction is noted. Electronically Signed   By: Alcide Clever M.D.   On: 03/05/2016 10:47    Medications: . antiseptic oral rinse  7 mL Mouth Rinse  q12n4p  . chlorhexidine  15 mL Mouth Rinse BID  . enoxaparin (LOVENOX) injection  60 mg Subcutaneous Q24H  . 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  I will discuss with DR. Gerkin.  She is up walking and feels better.  I let her have some coffee for breakfast.       LOS: 3 days    Alexah Kivett 03/07/2016 [phone]

## 2016-03-08 LAB — BASIC METABOLIC PANEL
Anion gap: 11 (ref 5–15)
BUN: 8 mg/dL (ref 6–20)
CHLORIDE: 108 mmol/L (ref 101–111)
CO2: 20 mmol/L — ABNORMAL LOW (ref 22–32)
CREATININE: 0.54 mg/dL (ref 0.44–1.00)
Calcium: 8.6 mg/dL — ABNORMAL LOW (ref 8.9–10.3)
GFR calc Af Amer: >60 mL/min (ref >60)
GFR calc non Af Amer: >60 mL/min (ref >60)
GLUCOSE: 80 mg/dL (ref 65–99)
POTASSIUM: 3.5 mmol/L (ref 3.5–5.1)
Sodium: 139 mmol/L (ref 135–145)

## 2016-03-08 LAB — CBC
HCT: 32.6 % — ABNORMAL LOW (ref 36.0–46.0)
HEMOGLOBIN: 11.6 g/dL — AB (ref 12.0–15.0)
MCH: 26.6 pg (ref 26.0–34.0)
MCHC: 35.6 g/dL (ref 30.0–36.0)
MCV: 74.8 fL — ABNORMAL LOW (ref 78.0–100.0)
PLATELETS: 215 10*3/uL (ref 150–400)
RBC: 4.36 MIL/uL (ref 3.87–5.11)
RDW: 14.7 % (ref 11.5–15.5)
WBC: 8.2 10*3/uL (ref 4.0–10.5)

## 2016-03-08 MED ORDER — IRBESARTAN 150 MG PO TABS
150.0000 mg | ORAL_TABLET | Freq: Every day | ORAL | Status: DC
  Administered 2016-03-08 – 2016-03-10 (×3): 150 mg via ORAL
  Filled 2016-03-08 (×3): qty 1

## 2016-03-08 MED ORDER — VALSARTAN-HYDROCHLOROTHIAZIDE 160-12.5 MG PO TABS
1.0000 | ORAL_TABLET | Freq: Every day | ORAL | Status: DC
  Filled 2016-03-08: qty 1

## 2016-03-08 MED ORDER — HYDROCHLOROTHIAZIDE 12.5 MG PO CAPS
12.5000 mg | ORAL_CAPSULE | Freq: Every day | ORAL | Status: DC
  Administered 2016-03-08 – 2016-03-10 (×3): 12.5 mg via ORAL
  Filled 2016-03-08 (×3): qty 1

## 2016-03-08 NOTE — Progress Notes (Signed)
  Subjective: Pt doing well. +BM   Objective: Vital signs in last 24 hours: Temp:  [98.1 F (36.7 C)-98.6 F (37 C)] 98.6 F (37 C) (05/13 0459) Pulse Rate:  [67-83] 83 (05/13 0459) Resp:  [18-19] 18 (05/13 0459) BP: (138-155)/(74-94) 138/81 mmHg (05/13 0459) SpO2:  [95 %-97 %] 97 % (05/13 0459) Last BM Date: 03/03/16  Intake/Output from previous day: 05/12 0701 - 05/13 0700 In: 2780 [P.O.:360; I.V.:2300; NG/GT:120] Out: 2400 [Urine:1600; Emesis/NG output:800] Intake/Output this shift: Total I/O In: -  Out: 450 [Urine:450]  General appearance: alert and cooperative GI: soft, non-tender; bowel sounds normal; no masses,  no organomegaly  Lab Results:   Recent Labs  03/08/16 0411  WBC 8.2  HGB 11.6*  HCT 32.6*  PLT 215   BMET  Recent Labs  03/08/16 0411  NA 139  K 3.5  CL 108  CO2 20*  GLUCOSE 80  BUN 8  CREATININE 0.54  CALCIUM 8.6*   PT/INR No results for input(s): LABPROT, INR in the last 72 hours. ABG No results for input(s): PHART, HCO3 in the last 72 hours.  Invalid input(s): PCO2, PO2  Studies/Results: Dg Abd 1 View  03/07/2016  CLINICAL DATA:  Small bowel obstruction, history hypertension, diabetes mellitus EXAM: ABDOMEN - 1 VIEW COMPARISON:  03/06/2016 FINDINGS: Nasogastric tube projects over proximal stomach. Persistent small bowel dilatation. Some gas and stool present in colon. Findings are compatible with persistent likely partial small bowel obstruction. No bowel wall thickening. Bones demineralized with degenerative disc disease changes lumbar spine. IMPRESSION: Persistent swell obstruction. Electronically Signed   By: Ulyses SouthwardMark  Boles M.D.   On: 03/07/2016 08:25   Dg Abd 1 View  03/06/2016  CLINICAL DATA:  [age] year old female with recent small bowel obstruction. Initial encounter. EXAM: ABDOMEN - 1 VIEW COMPARISON:  03/05/2016 and earlier, CT Abdomen and Pelvis 03/04/2016 FINDINGS: Portable AP supine view at 0901 hours. Enteric tube in place  in his been advanced, the side hole now projects at the level of the gastric fundus. Decreased but not resolved gaseous distension of small bowel in the mid abdomen. Residual gas-filled small bowel loops now measure at the upper limits of normal to mildly increased at 33 mm diameter. Similar volume of large bowel gas. No definite pneumoperitoneum on this supine view. Stable abdominal visceral contours. Stable visualized osseous structures. IMPRESSION: 1. Enteric tube has been advanced, side hole now at the level of the gastric fundus. 2. Significantly improved and nearly resolved small bowel obstruction gas pattern. Electronically Signed   By: Odessa FlemingH  Hall M.D.   On: 03/06/2016 10:18    Anti-infectives: Anti-infectives    None      Assessment/Plan: SBO 1. Adv diet to FLD 2. Mobilize    LOS: 4 days    Marigene EhlersRamirez Jr., [facility] 03/08/2016

## 2016-03-09 MED ORDER — PANTOPRAZOLE SODIUM 40 MG PO TBEC
40.0000 mg | DELAYED_RELEASE_TABLET | Freq: Two times a day (BID) | ORAL | Status: DC
  Administered 2016-03-09 – 2016-03-10 (×3): 40 mg via ORAL
  Filled 2016-03-09 (×2): qty 1

## 2016-03-09 NOTE — Progress Notes (Signed)
  Subjective: Pt doing well with no issues. Tol PO well +Bowel function  Objective: Vital signs in last 24 hours: Temp:  [98.6 F (37 C)-98.9 F (37.2 C)] 98.6 F (37 C) (05/14 0627) Pulse Rate:  [56-83] 56 (05/14 0627) Resp:  [18] 18 (05/14 0627) BP: (162-184)/(90-92) 162/90 mmHg (05/14 0627) SpO2:  [99 %] 99 % (05/14 0627) Last BM Date: 03/08/16  Intake/Output from previous day: 05/13 0701 - 05/14 0700 In: 2500 [I.V.:2500] Out: 4700 [Urine:4700] Intake/Output this shift:    General appearance: alert and cooperative GI: soft, non-tender; bowel sounds normal; no masses,  no organomegaly  Lab Results:   Recent Labs  03/08/16 0411  WBC 8.2  HGB 11.6*  HCT 32.6*  PLT 215   BMET  Recent Labs  03/08/16 0411  NA 139  K 3.5  CL 108  CO2 20*  GLUCOSE 80  BUN 8  CREATININE 0.54  CALCIUM 8.6*    Studies/Results: Dg Abd 1 View  03/07/2016  CLINICAL DATA:  Small bowel obstruction, history hypertension, diabetes mellitus EXAM: ABDOMEN - 1 VIEW COMPARISON:  03/06/2016 FINDINGS: Nasogastric tube projects over proximal stomach. Persistent small bowel dilatation. Some gas and stool present in colon. Findings are compatible with persistent likely partial small bowel obstruction. No bowel wall thickening. Bones demineralized with degenerative disc disease changes lumbar spine. IMPRESSION: Persistent swell obstruction. Electronically Signed   By: Ulyses SouthwardMark  Boles M.D.   On: 03/07/2016 08:25    Anti-infectives: Anti-infectives    None      Assessment/Plan: SBO -adv diet as tol -hopefully home later today or in AM -HLIVF   LOS: 5 days    Marigene Ehlersamirez Jr., [facility] 03/09/2016

## 2016-03-09 NOTE — Progress Notes (Signed)
Key Points: Use following P&T approved IV to PO antibiotic change policy.  Description contains the criteria that are approved Note: Policy Excludes:  Esophagectomy patientsPHARMACIST - PHYSICIAN COMMUNICATION DR:   CCS CONCERNING: IV to Oral Route Change Policy  RECOMMENDATION: This patient is receiving Protonix by the intravenous route.  Based on criteria approved by the Pharmacy and Therapeutics Committee, the intravenous medication(s) is/are being converted to the equivalent oral dose form(s).   DESCRIPTION: These criteria include:  The patient is eating (either orally or via tube) and/or has been taking other orally administered medications for a least 24 hours  The patient has no evidence of active gastrointestinal bleeding or impaired GI absorption (gastrectomy, short bowel, patient on TNA or NPO).  If you have questions about this conversion, please contact the Pharmacy Department  []   [phone]( [phone] )  [facility] []   [phone]( [phone] )  [facility] []   [phone]  [facility] []   [phone]( [phone] )  [facility] []   [phone]( [phone] )  [facility]   Emily FilbertLilliston, Sharona Rovner [place]teMichel[place], [facility] CenterRPH 03/09/2016 11:28 AM

## 2016-03-09 NOTE — Progress Notes (Signed)
Pt has refused CPAP for the night.  RT to monitor and assess as needed.  

## 2016-03-10 NOTE — ED Provider Notes (Signed)
CSN: [id number]     Arrival date & time 03/04/16  0350 History   First MD Initiated Contact with Patient 03/04/16 0815     Chief Complaint  Patient presents with  . Abdominal Pain      HPI  Expand All Collapse All   Pt brought in by EMS from home with c/o abd pain that started around 7pm last night Pt describes pain as sharp, stabbing and constant Pt states she had two episodes of vomiting the last being around midnight Pt denies diarrhea, dizziness, or nausea in route but upon arrival to the hospital is c/o nausea EMS states pt was diaphoretic upon their arrival EKG showed NSR IV was established by EMS and NS is infusing        Past Medical History  Diagnosis Date  . Hypertension   . Neck pain on right side   . Depression   . Arthritis     knee  . Back pain   . Sleep apnea 2008    does not use cpap regularly  . H/O diabetes mellitus   . GERD (gastroesophageal reflux disease)   . Diabetes mellitus without complication [facility])    Past Surgical History  Procedure Laterality Date  . Anterior fusion cervical spine  1998  . Cholecystectomy    . Tonsillectomy    . Hernia repair      hiatal hernia  . Laparoscopic gastric banding    . Abdominal hysterectomy    . Anterior cervical decomp/discectomy fusion  12/23/2011    Procedure: ANTERIOR CERVICAL DECOMPRESSION/DISCECTOMY FUSION 1 LEVEL;  Surgeon: Stefani Dama, MD;  Location: [facility]URO [facility];  Service: Neurosurgery;  Laterality: N/A;  Cervical Three-four Anterior cervical decompression/diskectomy fusion   History reviewed. No pertinent family history. Social History  Substance Use Topics  . Smoking status: Never Smoker   . Smokeless tobacco: Never Used  . Alcohol Use: No   OB History    No data available     Review of Systems  Constitutional: Negative for fever and unexpected weight change.  Gastrointestinal: Positive for nausea, vomiting, abdominal pain and abdominal distention. Negative for blood in stool.  All  other systems reviewed and are negative.     Allergies  Iohexol and Contrast media  Home Medications   Prior to Admission medications   Medication Sig Start Date End Date Taking? Authorizing Provider  citalopram (CELEXA) 40 MG tablet Take 40 mg by mouth daily.   Yes Historical Provider, MD  meloxicam (MOBIC) 15 MG tablet Take 15 mg by mouth daily.   Yes Historical Provider, MD  Multiple Vitamin (MULITIVITAMIN WITH MINERALS) TABS Take 1 tablet by mouth daily.   Yes Historical Provider, MD  omeprazole (PRILOSEC) 40 MG capsule Take 40 mg by mouth daily. 12/16/15  Yes Historical Provider, MD  traMADol (ULTRAM) 50 MG tablet Take 50 mg by mouth every 6 (six) hours as needed. pain 02/11/16  Yes Historical Provider, MD  traZODone (DESYREL) 50 MG tablet Take 50 mg by mouth at bedtime as needed. sleep 02/11/16  Yes Historical Provider, MD  valsartan-hydrochlorothiazide (DIOVAN-HCT) 160-12.5 MG tablet Take 1 tablet by mouth daily. 02/27/15  Yes Historical Provider, MD   BP 151/75 mmHg  Pulse 88  Temp(Src) 98.7 F (37.1 C) (Oral)  Resp 18  Ht  (1.727 m)  Wt 260 lb (117.935 kg)  BMI 39.54 kg/m2  SpO2 94% Physical Exam  Constitutional: She is oriented to person, place, and time. She appears well-developed and well-nourished. No  distress.  HENT:  Head: Normocephalic and atraumatic.  Eyes: Pupils are equal, round, and reactive to light.  Neck: Normal range of motion.  Cardiovascular: Normal rate and intact distal pulses.   Pulmonary/Chest: No respiratory distress.  Abdominal: Normal appearance. She exhibits distension. There is tenderness. There is no rebound and no guarding.  Musculoskeletal: Normal range of motion.  Neurological: She is alert and oriented to person, place, and time. No cranial nerve deficit.  Skin: Skin is warm and dry. No rash noted.  Psychiatric: She has a normal mood and affect. Her behavior is normal.  Nursing note and vitals reviewed.   ED Course  Procedures  (including critical care time) Labs Review   Results for orders placed or performed during the hospital encounter of 03/04/16  CBC  Result Value Ref Range   WBC  4.0 - 10.5 K/uL    QUESTIONABLE RESULTS, RECOMMEND RECOLLECT TO VERIFY   RBC  3.87 - 5.11 MIL/uL    QUESTIONABLE RESULTS, RECOMMEND RECOLLECT TO VERIFY   Hemoglobin  12.0 - 15.0 g/dL    QUESTIONABLE RESULTS, RECOMMEND RECOLLECT TO VERIFY   HCT  36.0 - 46.0 %    QUESTIONABLE RESULTS, RECOMMEND RECOLLECT TO VERIFY   MCV  78.0 - 100.0 fL    QUESTIONABLE RESULTS, RECOMMEND RECOLLECT TO VERIFY   MCH  26.0 - 34.0 pg    QUESTIONABLE RESULTS, RECOMMEND RECOLLECT TO VERIFY   MCHC  30.0 - 36.0 g/dL    QUESTIONABLE RESULTS, RECOMMEND RECOLLECT TO VERIFY   RDW  11.5 - 15.5 %    QUESTIONABLE RESULTS, RECOMMEND RECOLLECT TO VERIFY   Platelets  150 - 400 K/uL    QUESTIONABLE RESULTS, RECOMMEND RECOLLECT TO VERIFY  Urinalysis, Routine w reflex microscopic  Result Value Ref Range   Color, Urine AMBER (A) YELLOW   APPearance CLEAR CLEAR   Specific Gravity, Urine 1.025 1.005 - 1.030   pH 5.5 5.0 - 8.0   Glucose, UA NEGATIVE NEGATIVE mg/dL   Hgb urine dipstick TRACE (A) NEGATIVE   Bilirubin Urine SMALL (A) NEGATIVE   Ketones, ur 40 (A) NEGATIVE mg/dL   Protein, ur >213>300 (A) NEGATIVE mg/dL   Nitrite NEGATIVE NEGATIVE   Leukocytes, UA NEGATIVE NEGATIVE  CBC with Differential  Result Value Ref Range   WBC 10.1 4.0 - 10.5 K/uL   RBC 5.61 (H) 3.87 - 5.11 MIL/uL   Hemoglobin 15.3 (H) 12.0 - 15.0 g/dL   HCT 08.642.0 57.836.0 - 46.946.0 %   MCV 74.9 (L) 78.0 - 100.0 fL   MCH 27.3 26.0 - 34.0 pg   MCHC 36.4 (H) 30.0 - 36.0 g/dL   RDW 62.914.8 52.811.5 - 41.315.5 %   Platelets 262 150 - 400 K/uL   Neutrophils Relative % 88 %   Lymphocytes Relative 7 %   Monocytes Relative 5 %   Eosinophils Relative 0 %   Basophils Relative 0 %   Neutro Abs 8.9 (H) 1.7 - 7.7 K/uL   Lymphs Abs 0.7 0.7 - 4.0 K/uL   Monocytes Absolute 0.5 0.1 - 1.0 K/uL   Eosinophils Absolute  0.0 0.0 - 0.7 K/uL   Basophils Absolute 0.0 0.0 - 0.1 K/uL   RBC Morphology TARGET CELLS   Comprehensive metabolic panel  Result Value Ref Range   Sodium 138 135 - 145 mmol/L   Potassium 3.4 (L) 3.5 - 5.1 mmol/L   Chloride 100 (L) 101 - 111 mmol/L   CO2 23 22 - 32 mmol/L   Glucose, Bld 207 (H)  65 - 99 mg/dL   BUN 20 6 - 20 mg/dL   Creatinine, Ser 1.30 0.44 - 1.00 mg/dL   Calcium 86.5 8.9 - 78.4 mg/dL   Total Protein 8.4 (H) 6.5 - 8.1 g/dL   Albumin 4.7 3.5 - 5.0 g/dL   AST 24 15 - 41 U/L   ALT 20 14 - 54 U/L   Alkaline Phosphatase 60 38 - 126 U/L   Total Bilirubin 0.9 0.3 - 1.2 mg/dL   GFR calc non Af Amer >60 >60 mL/min   GFR calc Af Amer >60 >60 mL/min   Anion gap 15 5 - 15  Lipase, blood  Result Value Ref Range   Lipase 22 11 - 51 U/L  Urine microscopic-add on  Result Value Ref Range   Squamous Epithelial / LPF 0-5 (A) NONE SEEN   WBC, UA 0-5 0 - 5 WBC/hpf   RBC / HPF 0-5 0 - 5 RBC/hpf   Bacteria, UA FEW (A) NONE SEEN   Casts HYALINE CASTS (A) NEGATIVE   Urine-Other MUCOUS PRESENT   CBC with Differential/Platelet  Result Value Ref Range   WBC 11.3 (H) 4.0 - 10.5 K/uL   RBC 5.19 (H) 3.87 - 5.11 MIL/uL   Hemoglobin 14.0 12.0 - 15.0 g/dL   HCT 69.6 29.5 - 28.4 %   MCV 74.2 (L) 78.0 - 100.0 fL   MCH 27.0 26.0 - 34.0 pg   MCHC 36.4 (H) 30.0 - 36.0 g/dL   RDW 13.2 44.0 - 10.2 %   Platelets 254 150 - 400 K/uL   Neutrophils Relative % 86 %   Lymphocytes Relative 7 %   Monocytes Relative 7 %   Eosinophils Relative 0 %   Basophils Relative 0 %   Neutro Abs 9.7 (H) 1.7 - 7.7 K/uL   Lymphs Abs 0.8 0.7 - 4.0 K/uL   Monocytes Absolute 0.8 0.1 - 1.0 K/uL   Eosinophils Absolute 0.0 0.0 - 0.7 K/uL   Basophils Absolute 0.0 0.0 - 0.1 K/uL   RBC Morphology TARGET CELLS   Basic metabolic panel  Result Value Ref Range   Sodium 144 135 - 145 mmol/L   Potassium 3.9 3.5 - 5.1 mmol/L   Chloride 110 101 - 111 mmol/L   CO2 26 22 - 32 mmol/L   Glucose, Bld 139 (H) 65 - 99 mg/dL    BUN 18 6 - 20 mg/dL   Creatinine, Ser 7.25 0.44 - 1.00 mg/dL   Calcium 8.5 (L) 8.9 - 10.3 mg/dL   GFR calc non Af Amer >60 >60 mL/min   GFR calc Af Amer >60 >60 mL/min   Anion gap 8 5 - 15  CBC  Result Value Ref Range   WBC 4.9 4.0 - 10.5 K/uL   RBC 4.72 3.87 - 5.11 MIL/uL   Hemoglobin 12.6 12.0 - 15.0 g/dL   HCT 36.6 (L) 44.0 - 34.7 %   MCV 75.2 (L) 78.0 - 100.0 fL   MCH 26.7 26.0 - 34.0 pg   MCHC 35.5 30.0 - 36.0 g/dL   RDW 42.5 95.6 - 38.7 %   Platelets 246 150 - 400 K/uL  Hemoglobin A1c  Result Value Ref Range   Hgb A1c MFr Bld 6.5 (H) 4.8 - 5.6 %   Mean Plasma Glucose 140 mg/dL  CBC  Result Value Ref Range   WBC 8.2 4.0 - 10.5 K/uL   RBC 4.36 3.87 - 5.11 MIL/uL   Hemoglobin 11.6 (L) 12.0 - 15.0 g/dL   HCT  32.6 (L) 36.0 - 46.0 %   MCV 74.8 (L) 78.0 - 100.0 fL   MCH 26.6 26.0 - 34.0 pg   MCHC 35.6 30.0 - 36.0 g/dL   RDW 82.9 56.2 - 13.0 %   Platelets 215 150 - 400 K/uL  Basic metabolic panel  Result Value Ref Range   Sodium 139 135 - 145 mmol/L   Potassium 3.5 3.5 - 5.1 mmol/L   Chloride 108 101 - 111 mmol/L   CO2 20 (L) 22 - 32 mmol/L   Glucose, Bld 80 65 - 99 mg/dL   BUN 8 6 - 20 mg/dL   Creatinine, Ser 8.65 0.44 - 1.00 mg/dL   Calcium 8.6 (L) 8.9 - 10.3 mg/dL   GFR calc non Af Amer >60 >60 mL/min   GFR calc Af Amer >60 >60 mL/min   Anion gap 11 5 - 15   Ct Abdomen Pelvis Wo Contrast  03/04/2016  CLINICAL DATA:  Sharp abdominal pain starting last night, 2 episodes of vomiting, nausea EXAM: CT ABDOMEN AND PELVIS WITHOUT CONTRAST TECHNIQUE: Multidetector CT imaging of the abdomen and pelvis was performed following the standard protocol without IV contrast. COMPARISON:  None FINDINGS: Lower chest: Small hiatal hernia is noted. There is 6 mm nodule in left lower lobe posteromedially. 7 mm nodule is noted in left lower lobe laterally. There is subpleural 4 mm nodule in left lower lobe posteriorly. Hepatobiliary: The study is limited without IV contrast. The patient is  status postcholecystectomy. Surgical clips are noted just medial to mid stomach. No intrahepatic biliary ductal dilatation. Trace perihepatic ascites. Pancreas: Unenhanced pancreas is unremarkable. Spleen: Unenhanced spleen is unremarkable. Adrenals/Urinary Tract: No adrenal gland mass. Unenhanced kidneys are symmetrical in size. No hydronephrosis or hydroureter. No nephrolithiasis. No calcified ureteral calculi. Stomach/Bowel: There is no gastric outlet obstruction. Multiple fluid distended small bowel loops are noted in upper and lower abdomen. Air-fluid levels are noted in lower small bowel. Axial image 69 there is abrupt transition point in caliber of small bowel highly suspicious for small bowel obstruction. The terminal ileum is small caliber decompressed. Some residual stool noted within cecum. Normal appendix. No pericecal inflammation. Minimal stranding of mesenteric fat within lower pelvis. No definite mesenteric fluid collection. The left colon and sigmoid colon is empty partially collapsed. Vascular/Lymphatic: No retroperitoneal or mesenteric adenopathy. No aortic aneurysm. Reproductive: The patient is status post hysterectomy. Other: There is no evidence of free abdominal air. Trace posterior pelvic free fluid. No inguinal adenopathy. Musculoskeletal: No destructive bony lesions are noted. Mild degenerative changes thoracic and lumbar spine. No destructive bony lesions are noted within pelvis. Degenerative changes bilateral SI joints. IMPRESSION: 1. There are moderate distended small bowel loops with fluid and air-fluid levels within proximal and mid small bowel with abrupt transition point in caliber of small bowel in left anterior pelvis axial image 65. Findings highly suspicious for small bowel obstruction probable due to adhesions. The terminal ileum is decompressed small caliber. 2. No pericecal inflammation.  Normal appendix. 3. Trace perihepatic ascites. Trace free fluid noted within posterior  pelvis. 4. Scattered lung nodules are noted in left lower lobe the largest measures 7 mm. Non-contrast chest CT at 3-6 months is recommended. If the nodules are stable at time of repeat CT, then future CT at 18-24 months (from today's scan) is considered optional for low-risk patients, but is recommended for high-risk patients. This recommendation follows the consensus statement: Guidelines for Management of Incidental Pulmonary Nodules Detected on CT Images:From the Fleischner Society  2017; published online before print (10.1148/radiol.1610960454). 5. Status postcholecystectomy.  Status post hysterectomy. 6. Mild degenerative changes thoracolumbar spine. Electronically Signed   By: Natasha Mead M.D.   On: 03/04/2016 10:34   Dg Abd 1 View  03/07/2016  CLINICAL DATA:  Small bowel obstruction, history hypertension, diabetes mellitus EXAM: ABDOMEN - 1 VIEW COMPARISON:  03/06/2016 FINDINGS: Nasogastric tube projects over proximal stomach. Persistent small bowel dilatation. Some gas and stool present in colon. Findings are compatible with persistent likely partial small bowel obstruction. No bowel wall thickening. Bones demineralized with degenerative disc disease changes lumbar spine. IMPRESSION: Persistent swell obstruction. Electronically Signed   By: Ulyses Southward M.D.   On: 03/07/2016 08:25   Dg Abd 1 View  03/06/2016  CLINICAL DATA:  65 year old female with recent small bowel obstruction. Initial encounter. EXAM: ABDOMEN - 1 VIEW COMPARISON:  03/05/2016 and earlier, CT Abdomen and Pelvis 03/04/2016 FINDINGS: Portable AP supine view at 0901 hours. Enteric tube in place in his been advanced, the side hole now projects at the level of the gastric fundus. Decreased but not resolved gaseous distension of small bowel in the mid abdomen. Residual gas-filled small bowel loops now measure at the upper limits of normal to mildly increased at 33 mm diameter. Similar volume of large bowel gas. No definite pneumoperitoneum on  this supine view. Stable abdominal visceral contours. Stable visualized osseous structures. IMPRESSION: 1. Enteric tube has been advanced, side hole now at the level of the gastric fundus. 2. Significantly improved and nearly resolved small bowel obstruction gas pattern. Electronically Signed   By: Odessa Fleming M.D.   On: 03/06/2016 10:18   Dg Abd 1 View  03/05/2016  CLINICAL DATA:  Encounter for nasogastric tube placement. EXAM: ABDOMEN - 1 VIEW COMPARISON:  Same day. FINDINGS: Mildly dilated small bowel loops are noted concerning for distal small bowel obstruction. Nasogastric tube tip is seen in the region of the gastroesophageal junction. IMPRESSION: Mildly dilated small bowel loops concerning for distal small bowel obstruction. Nasogastric tube tip seen in region of gastroesophageal junction. Electronically Signed   By: Lupita Raider, M.D.   On: 03/05/2016 13:32   Dg Abd 1 View  03/04/2016  CLINICAL DATA:  Abdominal pain and vomiting. Small bowel obstruction. EXAM: ABDOMEN - 1 VIEW COMPARISON:  CT from earlier today FINDINGS: Nasogastric tube tip overlaps the lower esophagus. Leftward deviation of the lower catheter correlates with tortuosity of the lower esophagus on preceding CT. Need at least 10 cm advancement. There is dilated small bowel correlating with history of small bowel obstruction. Surgical clips around the proximal stomach. These results will be called to the ordering clinician or representative by the Radiologist Assistant, and communication documented in the PACS or zVision Dashboard. IMPRESSION: 1. Nasogastric tube tip at the distal esophagus. 2. Known small bowel obstruction. Electronically Signed   By: Marnee Spring M.D.   On: 03/04/2016 14:00   Dg Abd 2 Views  03/05/2016  CLINICAL DATA:  Check nasogastric catheter placement EXAM: ABDOMEN - 2 VIEW COMPARISON:  03/04/2016 FINDINGS: Nasogastric catheter is again noted coiled upon itself in the distal esophagus. Stable changes of  small-bowel obstruction are noted. No free air is seen. IMPRESSION: Nasogastric catheter coiled within the distal esophagus. Persistent small bowel obstruction is noted. Electronically Signed   By: Alcide Clever M.D.   On: 03/05/2016 10:47    NG tube placed.  Surgery consulted and will admit.  MDM   Final diagnoses:  SBO (small bowel obstruction) (HCC)  Encounter for imaging study to confirm nasogastric (NG) tube placement        Nelva Nay, MD 03/10/16 [phone]

## 2016-03-10 NOTE — Progress Notes (Signed)
Discharge instructions discussed with patient, verbalized agreement and understanding 

## 2016-03-10 NOTE — Discharge Instructions (Signed)
Small Bowel Obstruction °A small bowel obstruction is a blockage in the small bowel. The small bowel, which is also called the small intestine, is a long, slender tube that connects the stomach to the colon. When a person eats and drinks, food and fluids go from the stomach to the small bowel. This is where most of the nutrients in the food and fluids are absorbed. °A small bowel obstruction will prevent food and fluids from passing through the small bowel as they normally do during digestion. The small bowel can become partially or completely blocked. This can cause symptoms such as abdominal pain, vomiting, and bloating. If this condition is not treated, it can be dangerous because the small bowel could rupture. °CAUSES °Common causes of this condition include: °· Scar tissue from previous surgery or radiation treatment. °· Recent surgery. This may cause the movements of the bowel to slow down and cause food to block the intestine. °· Hernias. °· Inflammatory bowel disease (colitis). °· Twisting of the bowel (volvulus). °· Tumors. °· A foreign body. °· Slipping of a part of the bowel into another part (intussusception). °SYMPTOMS °Symptoms of this condition include: °· Abdominal pain. This may be dull cramps or sharp pain. It may occur in one area, or it may be present in the entire abdomen. Pain can range from mild to severe, depending on the degree of obstruction. °· Nausea and vomiting. Vomit may be greenish or a yellow bile color. °· Abdominal bloating. °· Constipation. °· Lack of passing gas. °· Frequent belching. °· Diarrhea. This may occur if the obstruction is partial and runny stool is able to leak around the obstruction. °DIAGNOSIS °This condition may be diagnosed based on a physical exam, medical history, and X-rays of the abdomen. You may also have other tests, such as a CT scan of the abdomen and pelvis. °TREATMENT °Treatment for this condition depends on the cause and severity of the problem.  Treatment options may include: °· Bed rest along with fluids and pain medicines that are given through an IV tube inserted into one of your veins. Sometimes, this is all that is needed for the obstruction to improve. °· Following a simple diet. In some cases, a clear liquid diet may be required for several days. This allows the bowel to rest. °· Placement of a small tube (nasogastric tube) into the stomach. When the bowel is blocked, it usually swells up like a balloon that is filled with air and fluids. The air and fluids may be removed by suction through the nasogastric tube. This can help with pain, discomfort, and nausea. It can also help the obstruction to clear up faster. °· Surgery. This may be required if other treatments do not work. Bowel obstruction from a hernia may require early surgery and can be an emergency procedure. Surgery may also be required for scar tissue that causes frequent or severe obstructions. °HOME CARE INSTRUCTIONS °· Get plenty of rest. °· Follow instructions from your health care provider about eating restrictions. You may need to avoid solid foods and consume only clear liquids until your condition improves. °· Take over-the-counter and prescription medicines only as told by your health care provider. °· Keep all follow-up visits as told by your health care provider. This is important. °SEEK MEDICAL CARE IF: °· You have a fever. °· You have chills. °SEEK IMMEDIATE MEDICAL CARE IF: °· You have increased pain or cramping. °· You vomit blood. °· You have uncontrolled vomiting or nausea. °· You cannot drink   fluids because of vomiting or pain.  You develop confusion.  You begin feeling very dry or thirsty (dehydrated).  You have severe bloating.  You feel extremely weak or you faint.   This information is not intended to replace advice given to you by your health care provider. Make sure you discuss any questions you have with your health care provider.   Document Released:  12/30/2005 Document Revised: 07/04/2015 Document Reviewed: 12/07/2014 Elsevier Interactive Patient Education 2016 [employer]  GETTING TO GOOD BOWEL HEALTH. Irregular bowel habits such as constipation and diarrhea can lead to many problems over time.  Having one soft bowel movement a day is the most important way to prevent further problems.  The anorectal canal is designed to handle stretching and feces to safely manage our ability to get rid of solid waste (feces, poop, stool) out of our body.  BUT, hard constipated stools can act like ripping concrete bricks and diarrhea can be a burning fire to this very sensitive area of our body, causing inflamed hemorrhoids, anal fissures, increasing risk is perirectal abscesses, abdominal pain/bloating, an making irritable bowel worse.     The goal: ONE SOFT BOWEL MOVEMENT A DAY!  To have soft, regular bowel movements:   Drink at least 8 tall glasses of water a day.    Take plenty of fiber.  Fiber is the undigested part of plant food that passes into the colon, acting s natures broom to encourage bowel motility and movement.  Fiber can absorb and hold large amounts of water. This results in a larger, bulkier stool, which is soft and easier to pass. Work gradually over several weeks up to 6 servings a day of fiber (25g a day even more if needed) in the form of: o Vegetables -- Root (potatoes, carrots, turnips), leafy green (lettuce, salad greens, celery, spinach), or cooked high residue (cabbage, broccoli, etc) o Fruit -- Fresh (unpeeled skin & pulp), Dried (prunes, apricots, cherries, etc ),  or stewed ( applesauce)  o Whole grain breads, pasta, etc (whole wheat)  o Bran cereals   Bulking Agents -- This type of water-retaining fiber generally is easily obtained each day by one of the following:  o Psyllium bran -- The psyllium plant is remarkable because its ground seeds can retain so much water. This product is available as Metamucil, Konsyl,  Effersyllium, Per Diem Fiber, or the less expensive generic preparation in drug and health food stores. Although labeled a laxative, it really is not a laxative.  o Methylcellulose -- This is another fiber derived from wood which also retains water. It is available as Citrucel. o Polyethylene Glycol - and artificial fiber commonly called Miralax or Glycolax.  It is helpful for people with gassy or bloated feelings with regular fiber o Flax Seed - a less gassy fiber than psyllium  No reading or other relaxing activity while on the toilet. If bowel movements take longer than 5 minutes, you are too constipated  AVOID CONSTIPATION.  High fiber and water intake usually takes care of this.  Sometimes a laxative is needed to stimulate more frequent bowel movements, but   Laxatives are not a good long-term solution as it can wear the colon out. o Osmotics (Milk of Magnesia, Fleets phosphosoda, Magnesium citrate, MiraLax, GoLytely) are safer than  o Stimulants (Senokot, Castor Oil, Dulcolax, Ex Lax)    o Do not take laxatives for more than 7days in a row.   IF SEVERELY CONSTIPATED, try a Bowel Retraining Program: o  Do not use laxatives.  o Eat a diet high in roughage, such as bran cereals and leafy vegetables.  o Drink six (6) ounces of prune or apricot juice each morning.  o Eat two (2) large servings of stewed fruit each day.  o Take one (1) heaping tablespoon of a psyllium-based bulking agent twice a day. Use sugar-free sweetener when possible to avoid excessive calories.  o Eat a normal breakfast.  o Set aside 15 minutes after breakfast to sit on the toilet, but do not strain to have a bowel movement.  o If you do not have a bowel movement by the third day, use an enema and repeat the above steps.   Controlling diarrhea o Switch to liquids and simpler foods for a few days to avoid stressing your intestines further. o Avoid dairy products (especially milk & ice cream) for a short time.  The  intestines often can lose the ability to digest lactose when stressed. o Avoid foods that cause gassiness or bloating.  Typical foods include beans and other legumes, cabbage, broccoli, and dairy foods.  Every person has some sensitivity to other foods, so listen to our body and avoid those foods that trigger problems for you. o Adding fiber (Citrucel, Metamucil, psyllium, Miralax) gradually can help thicken stools by absorbing excess fluid and retrain the intestines to act more normally.  Slowly increase the dose over a few weeks.  Too much fiber too soon can backfire and cause cramping & bloating. o Probiotics (such as active yogurt, Align, etc) may help repopulate the intestines and colon with normal bacteria and calm down a sensitive digestive tract.  Most studies show it to be of mild help, though, and such products can be costly. o Medicines: - Bismuth subsalicylate (ex. Kayopectate, Pepto Bismol) every 30 minutes for up to 6 doses can help control diarrhea.  Avoid if pregnant. - Loperamide (Immodium) can slow down diarrhea.  Start with two tablets (4mg  total) first and then try one tablet every 6 hours.  Avoid if you are having fevers or severe pain.  If you are not better or start feeling worse, stop all medicines and call your doctor for advice o Call your doctor if you are getting worse or not better.  Sometimes further testing (cultures, endoscopy, X-ray studies, bloodwork, etc) may be needed to help diagnose and treat the cause of the diarrhea

## 2016-03-10 NOTE — Discharge Summary (Signed)
Physician Discharge Summary  [name] WUJ:[id number] DOB: Apr 02, 1951 DOA: 03/04/2016  PCP: Jerral RalphMyers, Stephanie J, MD  Consultation: none  Admit date: 03/04/2016 Discharge date: 03/10/2016  Recommendations for Outpatient Follow-up:   Follow-up Information    Follow up with Jerral RalphMyers, Stephanie J, MD In 2 weeks.   Specialty:  Internal Medicine   Why:  hospital follow up    Contact information:   [address] [place] [address] [phone]      Discharge Diagnoses:  1. Small bowel obstruction   Surgical Procedure: none  Discharge Condition: stable Disposition: home  Diet recommendation: regular  Filed Weights   03/04/16 1357  Weight: 117.935 kg (260 lb)    Filed Vitals:   03/09/16 2121 03/10/16 0603  BP: 140/76 151/75  Pulse: 77 88  Temp: 98.5 F (36.9 C) 98.7 F (37.1 C)  Resp: 18 18      Hospital Course:  Natalie Silva is a 65 year old female with a history of gastric banding, cholecystectomy, abdominal hysterectomy, hernia repair who presented with abdominal pain.  She was found to have a bowel obstruction and admitted for gastric tube decompression, bowel rest and IV hydration. She clinically improved, NGT was clamped and removed.  Diet was advanced.  On HD#6 she was having BMs, tolerating solids and pain free.  She was therefore felt stable for discharge home.  Warning signs that warrant immediate attention were discussed.    Physical Exam: General: NAD.  VSS. Abdomen: soft  And non tender   Discharge Instructions     Medication List    TAKE these medications        citalopram 40 MG tablet  Commonly known as:  CELEXA  Take 40 mg by mouth daily.     meloxicam 15 MG tablet  Commonly known as:  MOBIC  Take 15 mg by mouth daily.     multivitamin with minerals Tabs tablet  Take 1 tablet by mouth daily.     omeprazole 40 MG capsule  Commonly known as:  PRILOSEC  Take 40 mg by mouth daily.     traMADol 50 MG tablet   Commonly known as:  ULTRAM  Take 50 mg by mouth every 6 (six) hours as needed. pain     traZODone 50 MG tablet  Commonly known as:  DESYREL  Take 50 mg by mouth at bedtime as needed. sleep     valsartan-hydrochlorothiazide 160-12.5 MG tablet  Commonly known as:  DIOVAN-HCT  Take 1 tablet by mouth daily.           Follow-up Information    Follow up with Jerral RalphMyers, Stephanie J, MD In 2 weeks.   Specialty:  Internal Medicine   Why:  hospital follow up    Contact information:   [address] [place] [address] [phone]        The results of significant diagnostics from this hospitalization (including imaging, microbiology, ancillary and laboratory) are listed below for reference.    Significant Diagnostic Studies: Ct Abdomen Pelvis Wo Contrast  03/04/2016  CLINICAL DATA:  Sharp abdominal pain starting last night, 2 episodes of vomiting, nausea EXAM: CT ABDOMEN AND PELVIS WITHOUT CONTRAST TECHNIQUE: Multidetector CT imaging of the abdomen and pelvis was performed following the standard protocol without IV contrast. COMPARISON:  None FINDINGS: Lower chest: Small hiatal hernia is noted. There is 6 mm nodule in left lower lobe posteromedially. 7 mm nodule is noted in left lower lobe laterally. There is subpleural  4 mm nodule in left lower lobe posteriorly. Hepatobiliary: The study is limited without IV contrast. The patient is status postcholecystectomy. Surgical clips are noted just medial to mid stomach. No intrahepatic biliary ductal dilatation. Trace perihepatic ascites. Pancreas: Unenhanced pancreas is unremarkable. Spleen: Unenhanced spleen is unremarkable. Adrenals/Urinary Tract: No adrenal gland mass. Unenhanced kidneys are symmetrical in size. No hydronephrosis or hydroureter. No nephrolithiasis. No calcified ureteral calculi. Stomach/Bowel: There is no gastric outlet obstruction. Multiple fluid distended small bowel loops are noted in upper and lower abdomen. Air-fluid  levels are noted in lower small bowel. Axial image 69 there is abrupt transition point in caliber of small bowel highly suspicious for small bowel obstruction. The terminal ileum is small caliber decompressed. Some residual stool noted within cecum. Normal appendix. No pericecal inflammation. Minimal stranding of mesenteric fat within lower pelvis. No definite mesenteric fluid collection. The left colon and sigmoid colon is empty partially collapsed. Vascular/Lymphatic: No retroperitoneal or mesenteric adenopathy. No aortic aneurysm. Reproductive: The patient is status post hysterectomy. Other: There is no evidence of free abdominal air. Trace posterior pelvic free fluid. No inguinal adenopathy. Musculoskeletal: No destructive bony lesions are noted. Mild degenerative changes thoracic and lumbar spine. No destructive bony lesions are noted within pelvis. Degenerative changes bilateral SI joints. IMPRESSION: 1. There are moderate distended small bowel loops with fluid and air-fluid levels within proximal and mid small bowel with abrupt transition point in caliber of small bowel in left anterior pelvis axial image 65. Findings highly suspicious for small bowel obstruction probable due to adhesions. The terminal ileum is decompressed small caliber. 2. No pericecal inflammation.  Normal appendix. 3. Trace perihepatic ascites. Trace free fluid noted within posterior pelvis. 4. Scattered lung nodules are noted in left lower lobe the largest measures 7 mm. Non-contrast chest CT at 3-6 months is recommended. If the nodules are stable at time of repeat CT, then future CT at 18-24 months (from today's scan) is considered optional for low-risk patients, but is recommended for high-risk patients. This recommendation follows the consensus statement: Guidelines for Management of Incidental Pulmonary Nodules Detected on CT Images:From the Fleischner Society 2017; published online before print (10.1148/radiol.[id number]). 5. Status  postcholecystectomy.  Status post hysterectomy. 6. Mild degenerative changes thoracolumbar spine. Electronically Signed   By: Natasha Mead M.D.   On: 03/04/2016 10:34   Dg Abd 1 View  03/07/2016  CLINICAL DATA:  Small bowel obstruction, history hypertension, diabetes mellitus EXAM: ABDOMEN - 1 VIEW COMPARISON:  03/06/2016 FINDINGS: Nasogastric tube projects over proximal stomach. Persistent small bowel dilatation. Some gas and stool present in colon. Findings are compatible with persistent likely partial small bowel obstruction. No bowel wall thickening. Bones demineralized with degenerative disc disease changes lumbar spine. IMPRESSION: Persistent swell obstruction. Electronically Signed   By: Ulyses Southward M.D.   On: 03/07/2016 08:25   Dg Abd 1 View  03/06/2016  CLINICAL DATA:  65 year old female with recent small bowel obstruction. Initial encounter. EXAM: ABDOMEN - 1 VIEW COMPARISON:  03/05/2016 and earlier, CT Abdomen and Pelvis 03/04/2016 FINDINGS: Portable AP supine view at 0901 hours. Enteric tube in place in his been advanced, the side hole now projects at the level of the gastric fundus. Decreased but not resolved gaseous distension of small bowel in the mid abdomen. Residual gas-filled small bowel loops now measure at the upper limits of normal to mildly increased at 33 mm diameter. Similar volume of large bowel gas. No definite pneumoperitoneum on this supine view. Stable abdominal visceral contours. Stable  visualized osseous structures. IMPRESSION: 1. Enteric tube has been advanced, side hole now at the level of the gastric fundus. 2. Significantly improved and nearly resolved small bowel obstruction gas pattern. Electronically Signed   By: Odessa Fleming M.D.   On: 03/06/2016 10:18   Dg Abd 1 View  03/05/2016  CLINICAL DATA:  Encounter for nasogastric tube placement. EXAM: ABDOMEN - 1 VIEW COMPARISON:  Same day. FINDINGS: Mildly dilated small bowel loops are noted concerning for distal small bowel  obstruction. Nasogastric tube tip is seen in the region of the gastroesophageal junction. IMPRESSION: Mildly dilated small bowel loops concerning for distal small bowel obstruction. Nasogastric tube tip seen in region of gastroesophageal junction. Electronically Signed   By: Lupita Raider, M.D.   On: 03/05/2016 13:32   Dg Abd 1 View  03/04/2016  CLINICAL DATA:  Abdominal pain and vomiting. Small bowel obstruction. EXAM: ABDOMEN - 1 VIEW COMPARISON:  CT from earlier today FINDINGS: Nasogastric tube tip overlaps the lower esophagus. Leftward deviation of the lower catheter correlates with tortuosity of the lower esophagus on preceding CT. Need at least 10 cm advancement. There is dilated small bowel correlating with history of small bowel obstruction. Surgical clips around the proximal stomach. These results will be called to the ordering clinician or representative by the Radiologist Assistant, and communication documented in the PACS or zVision Dashboard. IMPRESSION: 1. Nasogastric tube tip at the distal esophagus. 2. Known small bowel obstruction. Electronically Signed   By: Marnee Spring M.D.   On: 03/04/2016 14:00   Dg Abd 2 Views  03/05/2016  CLINICAL DATA:  Check nasogastric catheter placement EXAM: ABDOMEN - 2 VIEW COMPARISON:  03/04/2016 FINDINGS: Nasogastric catheter is again noted coiled upon itself in the distal esophagus. Stable changes of small-bowel obstruction are noted. No free air is seen. IMPRESSION: Nasogastric catheter coiled within the distal esophagus. Persistent small bowel obstruction is noted. Electronically Signed   By: Alcide Clever M.D.   On: 03/05/2016 10:47    Microbiology: No results found for this or any previous visit (from the past 240 hour(s)).   Labs: Basic Metabolic Panel:  Recent Labs Lab 03/04/16 0529 03/05/16 0437 03/08/16 0411  NA 138 144 139  K 3.4* 3.9 3.5  CL 100* 110 108  CO2 23 26 20*  GLUCOSE 207* 139* 80  BUN 20 18 8   CREATININE 0.92 0.86  0.54  CALCIUM 10.0 8.5* 8.6*   Liver Function Tests:  Recent Labs Lab 03/04/16 0529  AST 24  ALT 20  ALKPHOS 60  BILITOT 0.9  PROT 8.4*  ALBUMIN 4.7    Recent Labs Lab 03/04/16 0529  LIPASE 22   No results for input(s): AMMONIA in the last 168 hours. CBC:  Recent Labs Lab 03/04/16 0426 03/04/16 0529 03/04/16 0848 03/05/16 0437 03/08/16 0411  WBC QUESTIONABLE RESULTS, RECOMMEND RECOLLECT TO VERIFY 10.1 11.3* 4.9 8.2  NEUTROABS  --  8.9* 9.7*  --   --   HGB QUESTIONABLE RESULTS, RECOMMEND RECOLLECT TO VERIFY 15.3* 14.0 12.6 11.6*  HCT QUESTIONABLE RESULTS, RECOMMEND RECOLLECT TO VERIFY 42.0 38.5 35.5* 32.6*  MCV QUESTIONABLE RESULTS, RECOMMEND RECOLLECT TO VERIFY 74.9* 74.2* 75.2* 74.8*  PLT QUESTIONABLE RESULTS, RECOMMEND RECOLLECT TO VERIFY 262 254 246 215   Cardiac Enzymes: No results for input(s): CKTOTAL, CKMB, CKMBINDEX, TROPONINI in the last 168 hours. BNP: BNP (last 3 results) No results for input(s): BNP in the last 8760 hours.  ProBNP (last 3 results) No results for input(s): PROBNP in the  last 8760 hours.  CBG: No results for input(s): GLUCAP in the last 168 hours.  Active Problems:   SBO (small bowel obstruction) (HCC)   Time coordinating discharge: <30 mins   Signed:  Keta [place], ANP-BC

## 2016-03-13 ENCOUNTER — Ambulatory Visit: Payer: Self-pay

## 2016-04-01 ENCOUNTER — Ambulatory Visit
Admission: RE | Admit: 2016-04-01 | Discharge: 2016-04-01 | Disposition: A | Discharge status: Home / Self Care | Payer: Medicare Other | Source: Ambulatory Visit

## 2016-04-01 ENCOUNTER — Other Ambulatory Visit: Payer: Self-pay | Admitting: Internal Medicine

## 2016-04-01 DIAGNOSIS — Z1231 Encounter for screening mammogram for malignant neoplasm of breast: Secondary | ICD-10-CM

## 2017-03-11 ENCOUNTER — Other Ambulatory Visit: Payer: Self-pay | Admitting: Internal Medicine

## 2017-03-11 DIAGNOSIS — Z1231 Encounter for screening mammogram for malignant neoplasm of breast: Secondary | ICD-10-CM

## 2017-04-16 ENCOUNTER — Ambulatory Visit
Admission: RE | Admit: 2017-04-16 | Discharge: 2017-04-16 | Disposition: A | Discharge status: Home / Self Care | Payer: Medicare Other | Source: Ambulatory Visit | Attending: Internal Medicine | Admitting: Internal Medicine

## 2017-04-16 DIAGNOSIS — Z1231 Encounter for screening mammogram for malignant neoplasm of breast: Secondary | ICD-10-CM

## 2017-04-28 IMAGING — DX DG ABDOMEN 1V
1 series · 1 of 1 positions shown · non-contrast
Comparison: Same day.

CLINICAL DATA: Encounter for nasogastric tube placement.

EXAM:
ABDOMEN - 1 VIEW

[abdomen kub]
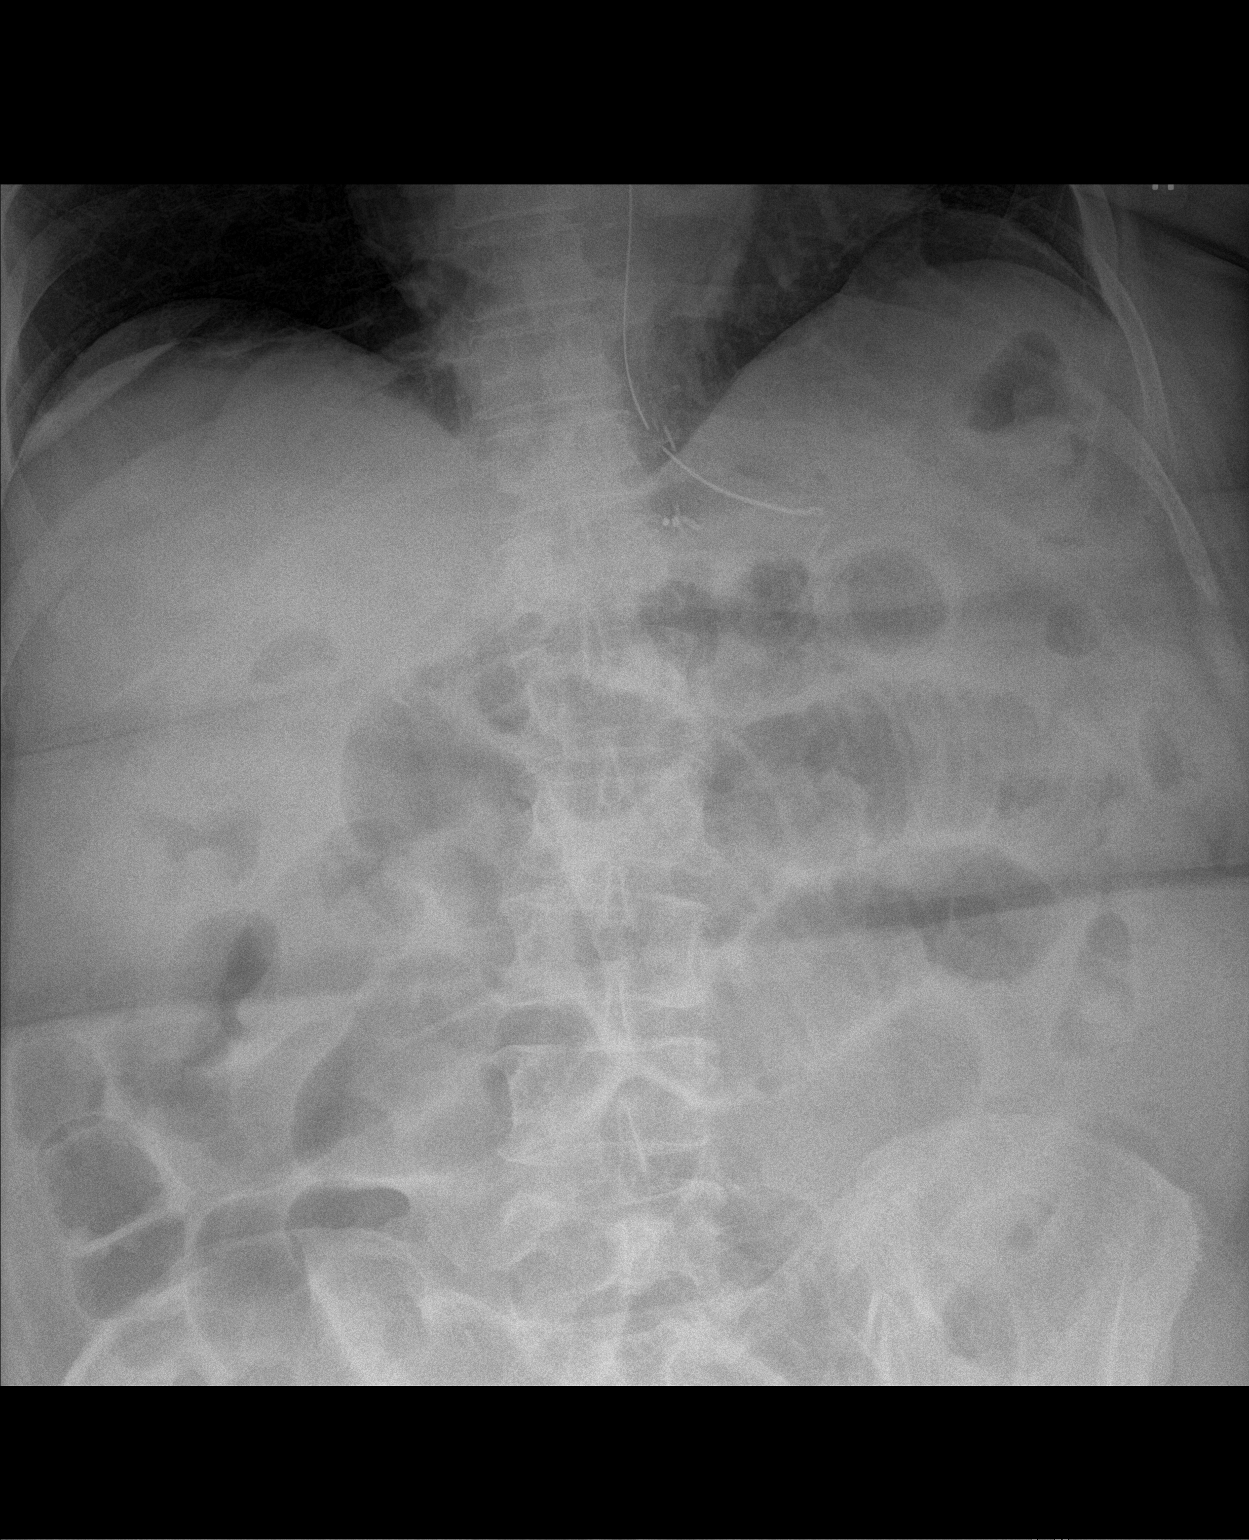

[1 of 1 positions shown; findings below may reference images not displayed]

FINDINGS: Mildly dilated small bowel loops are noted concerning for distal
small bowel obstruction. Nasogastric tube tip is seen in the region
of the gastroesophageal junction.
IMPRESSION: Mildly dilated small bowel loops concerning for distal small bowel
obstruction. Nasogastric tube tip seen in region of gastroesophageal
junction.

## 2017-04-28 IMAGING — DX DG ABDOMEN 2V
3 series · 3 of 3 positions shown · non-contrast
Comparison: 03/04/2016

CLINICAL DATA: Check nasogastric catheter placement

EXAM:
ABDOMEN - 2 VIEW

[abdomen erect]
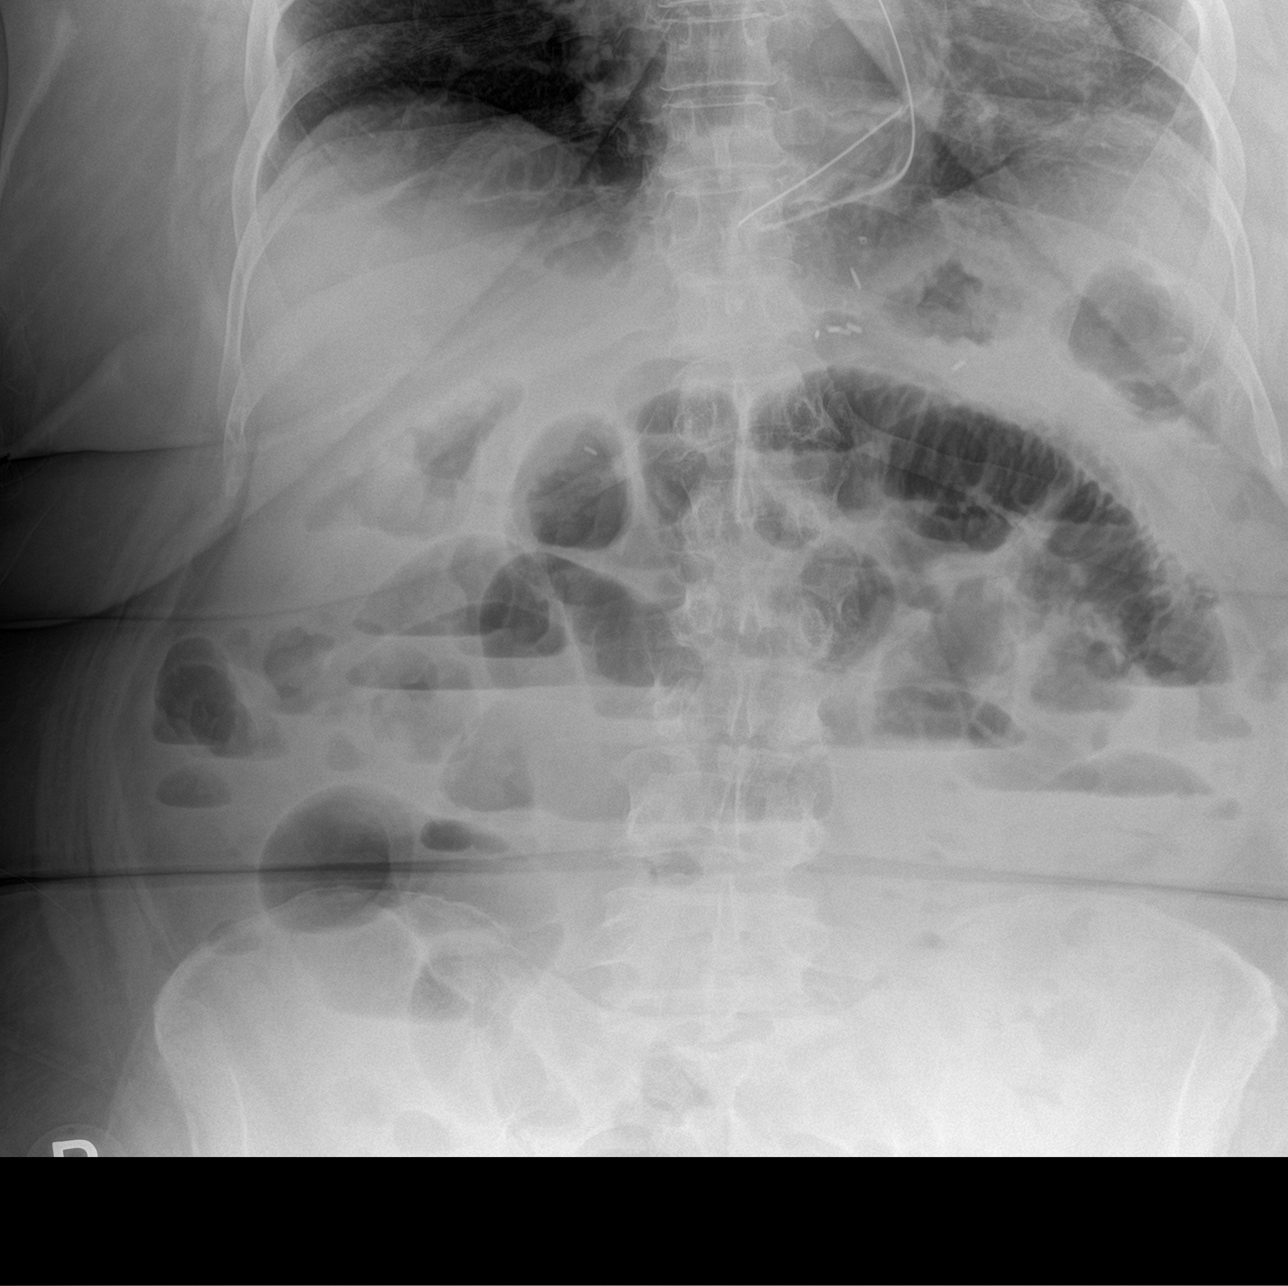

[abdomen supine (1 of 2)]
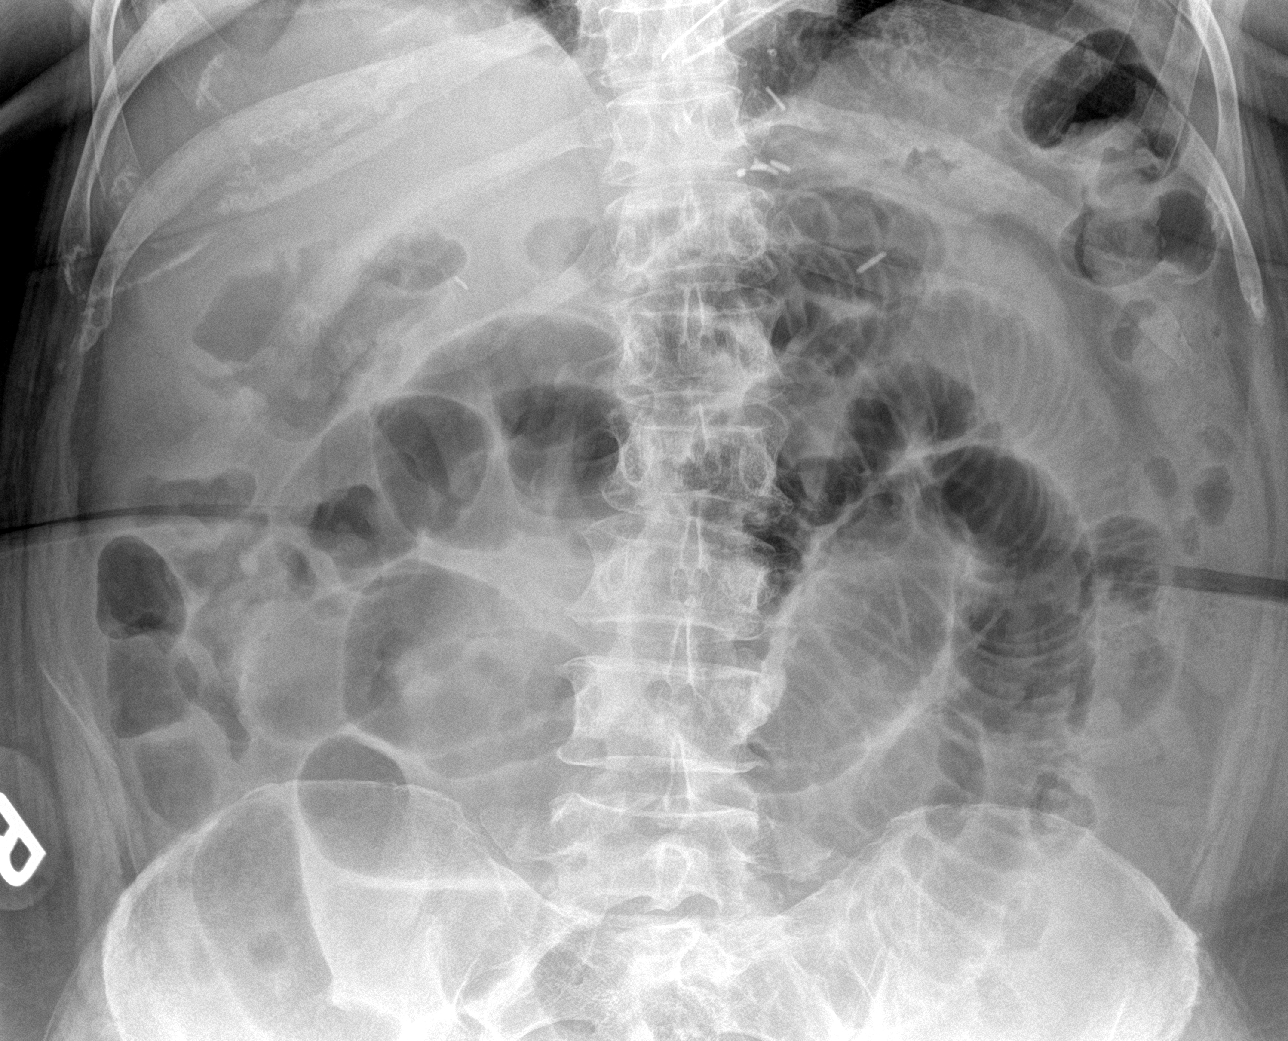

[abdomen supine (2 of 2)]
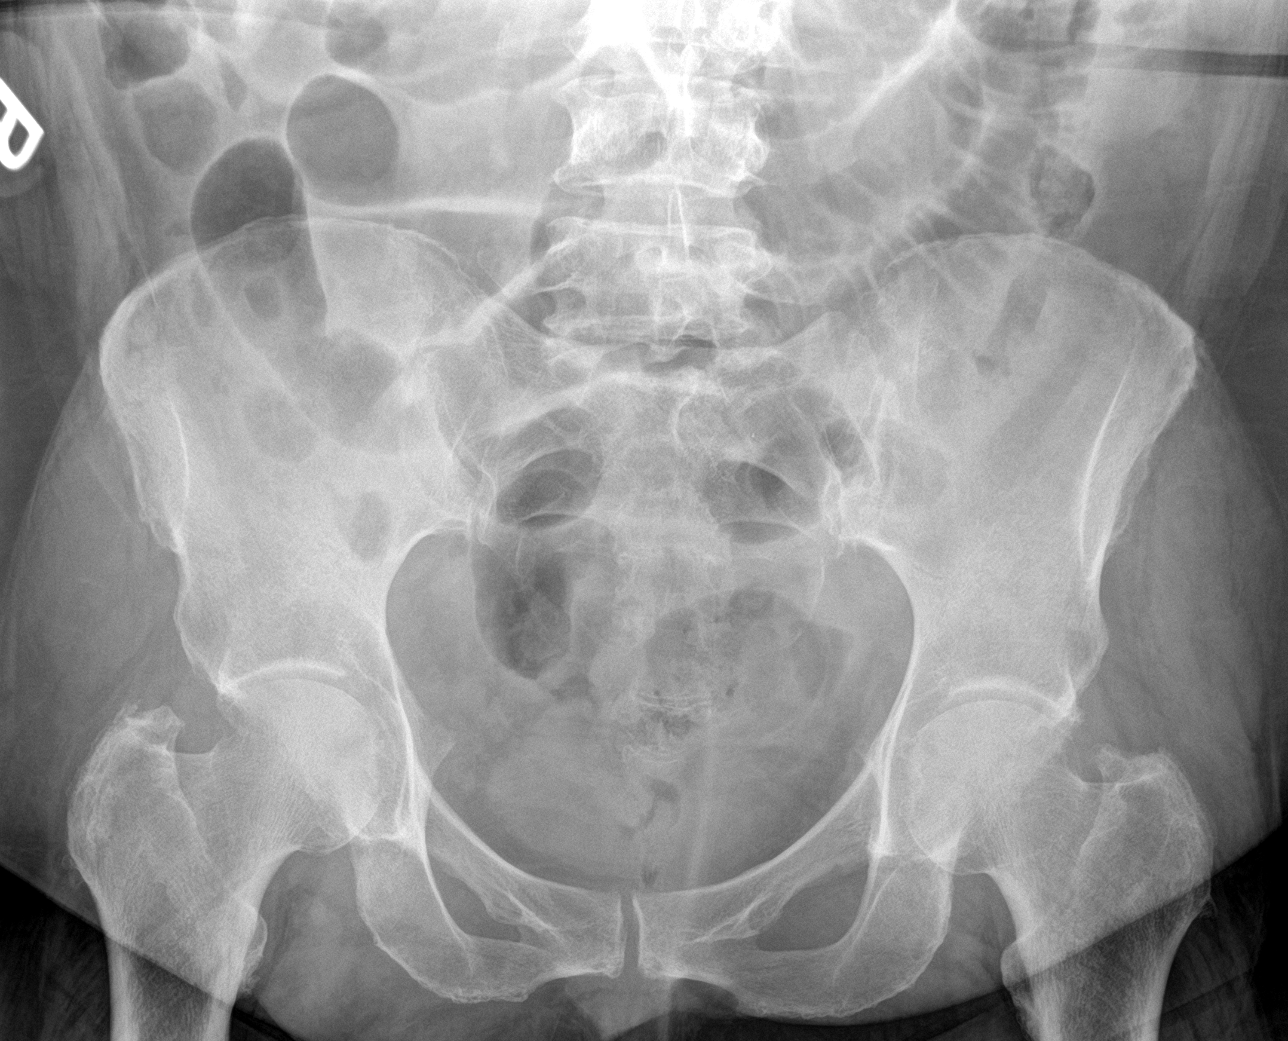

[3 of 3 positions shown; findings below may reference images not displayed]

FINDINGS: Nasogastric catheter is again noted coiled upon itself in the distal
esophagus. Stable changes of small-bowel obstruction are noted. No
free air is seen.
IMPRESSION: Nasogastric catheter coiled within the distal esophagus. Persistent
small bowel obstruction is noted.

## 2017-04-30 IMAGING — DX DG ABDOMEN 1V
2 series · 2 of 2 positions shown · non-contrast
Comparison: 03/06/2016

CLINICAL DATA: Small bowel obstruction, history hypertension,
diabetes mellitus

EXAM:
ABDOMEN - 1 VIEW

[abdomen kub (1 of 2)]
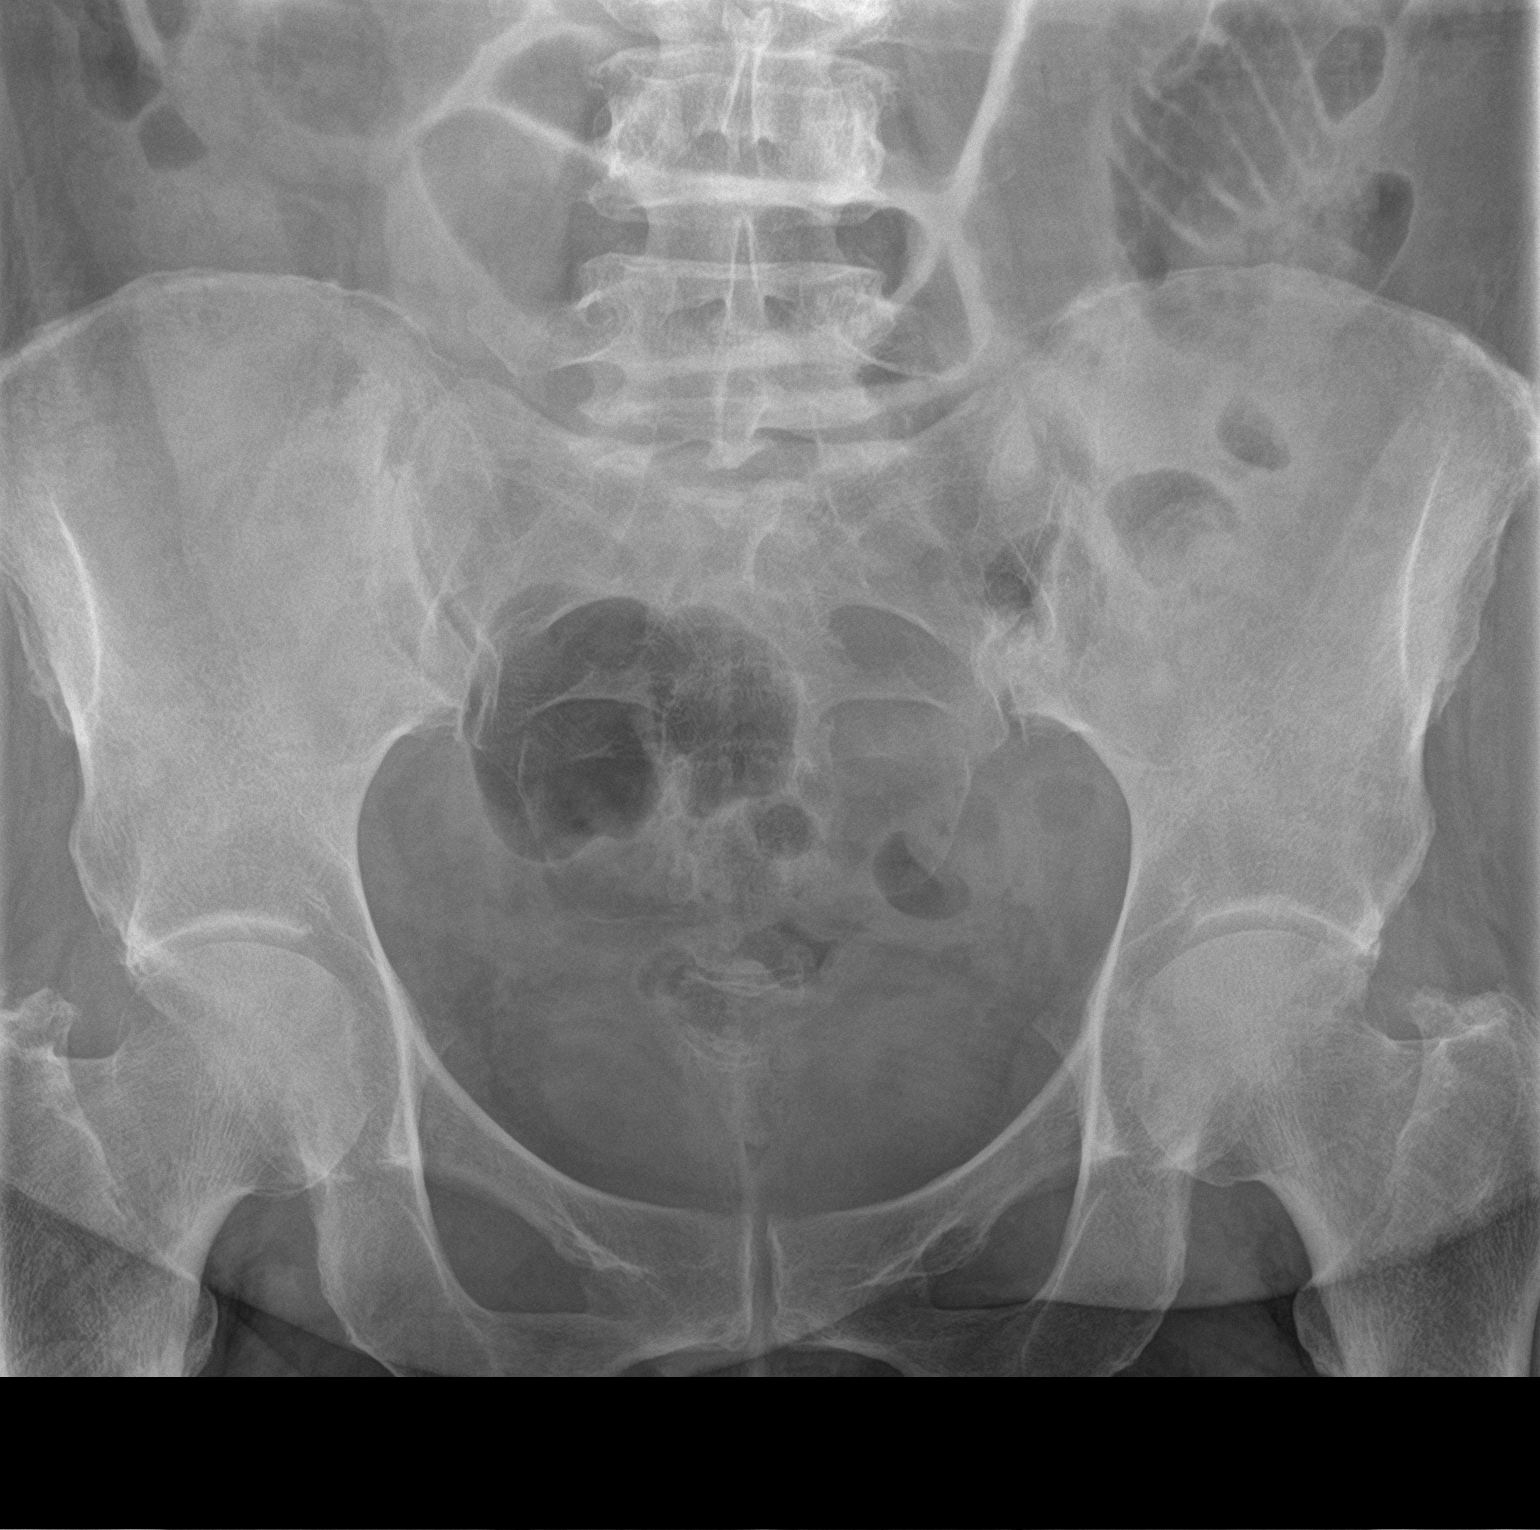

[abdomen kub (2 of 2)]
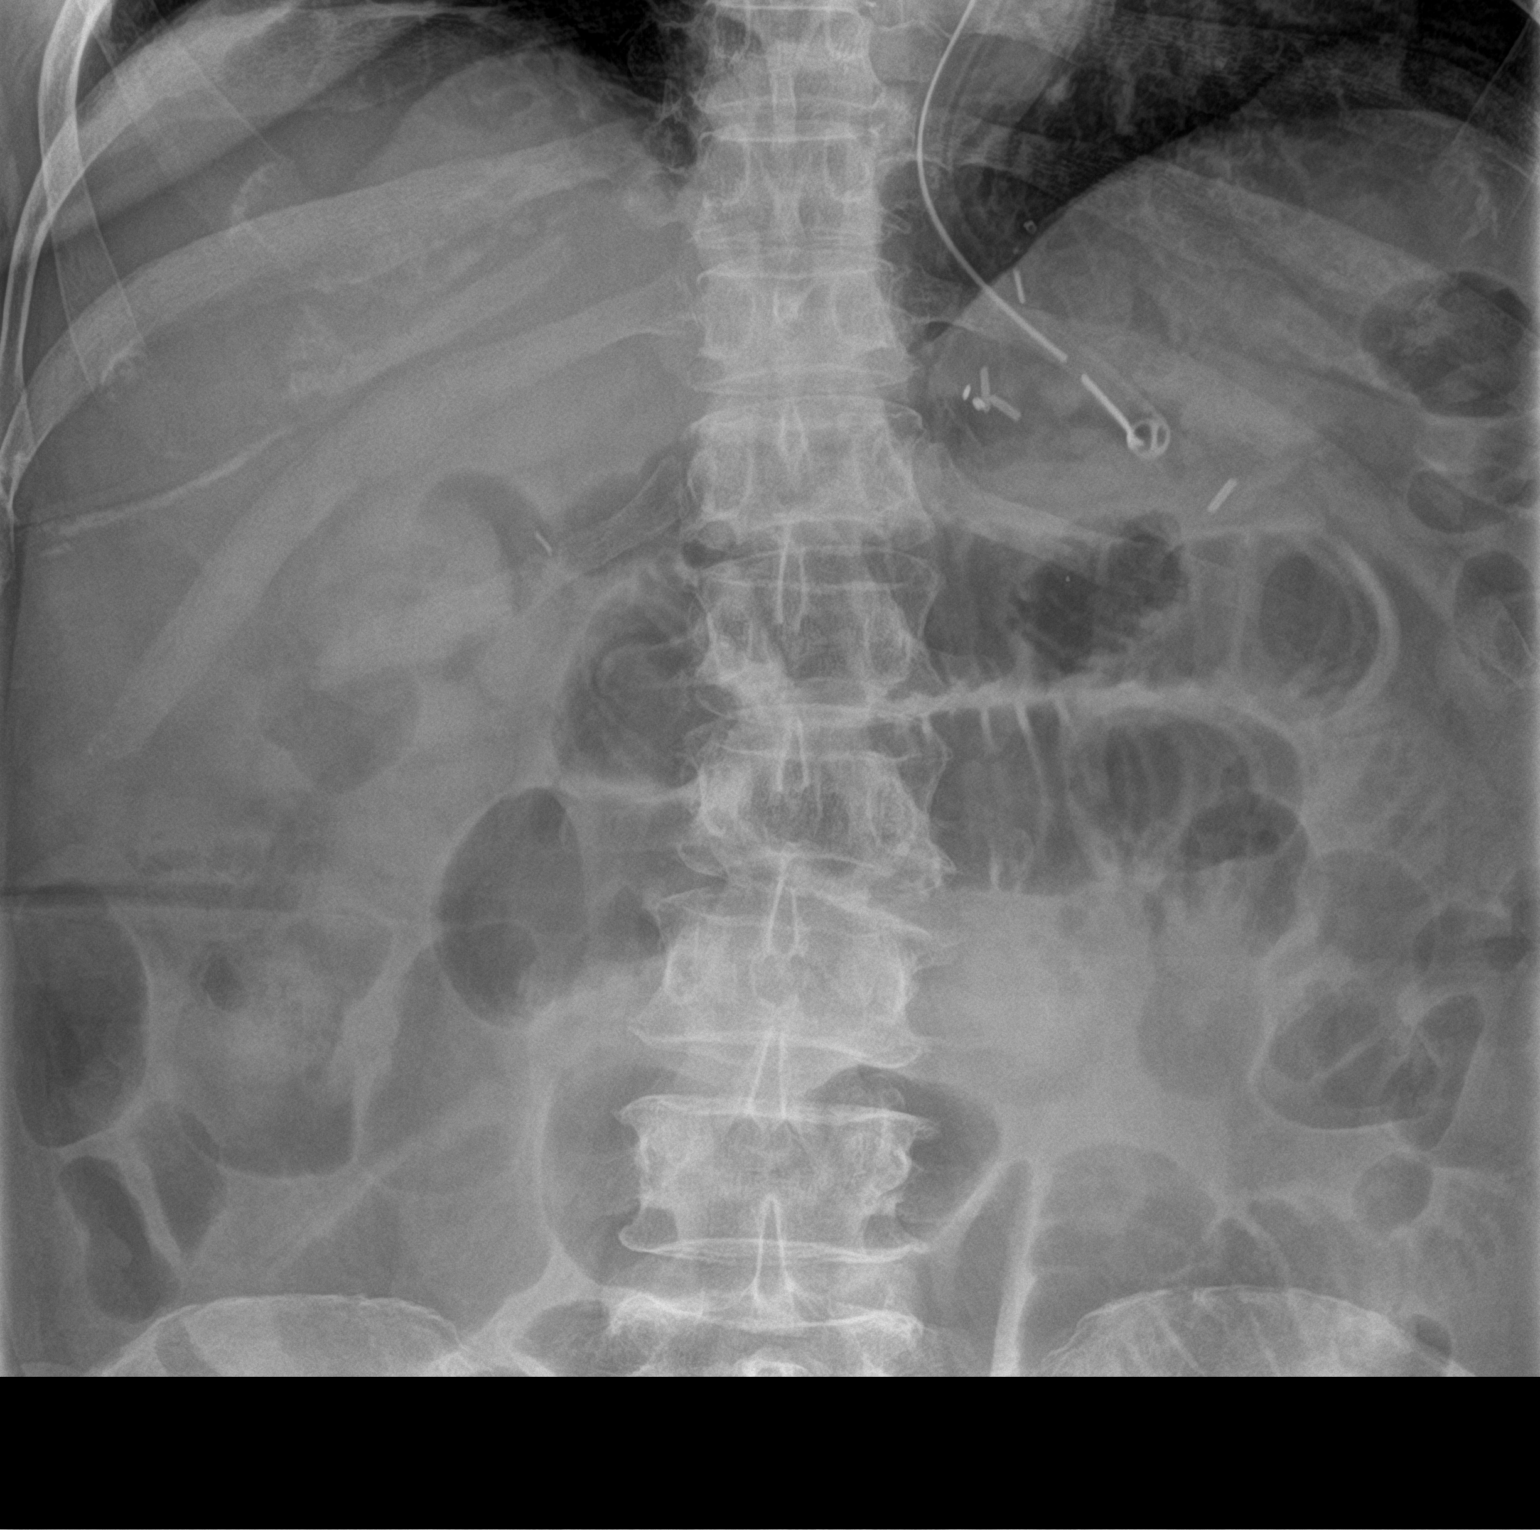

[2 of 2 positions shown; findings below may reference images not displayed]

FINDINGS: Nasogastric tube projects over proximal stomach.

Persistent small bowel dilatation.

Some gas and stool present in colon.

Findings are compatible with persistent likely partial small bowel
obstruction.

No bowel wall thickening.

Bones demineralized with degenerative disc disease changes lumbar
spine.
IMPRESSION: Persistent swell obstruction.

## 2018-03-15 ENCOUNTER — Other Ambulatory Visit: Payer: Self-pay | Admitting: Internal Medicine

## 2018-03-15 DIAGNOSIS — Z1231 Encounter for screening mammogram for malignant neoplasm of breast: Secondary | ICD-10-CM

## 2018-05-25 ENCOUNTER — Ambulatory Visit: Payer: Self-pay

## 2018-09-10 ENCOUNTER — Ambulatory Visit
Admission: RE | Admit: 2018-09-10 | Discharge: 2018-09-10 | Disposition: A | Discharge status: Home / Self Care | Payer: Medicare Other | Source: Ambulatory Visit | Attending: Internal Medicine | Admitting: Internal Medicine

## 2018-09-10 DIAGNOSIS — Z1231 Encounter for screening mammogram for malignant neoplasm of breast: Secondary | ICD-10-CM

## 2019-02-25 ENCOUNTER — Other Ambulatory Visit: Payer: Self-pay | Admitting: Internal Medicine

## 2019-02-25 DIAGNOSIS — Z1231 Encounter for screening mammogram for malignant neoplasm of breast: Secondary | ICD-10-CM

## 2019-09-13 ENCOUNTER — Ambulatory Visit
Admission: RE | Admit: 2019-09-13 | Discharge: 2019-09-13 | Disposition: A | Discharge status: Home / Self Care | Payer: Medicare Other | Source: Ambulatory Visit | Attending: Internal Medicine | Admitting: Internal Medicine

## 2019-09-13 ENCOUNTER — Other Ambulatory Visit: Payer: Self-pay

## 2019-09-13 DIAGNOSIS — Z1231 Encounter for screening mammogram for malignant neoplasm of breast: Secondary | ICD-10-CM

## 2020-08-16 ENCOUNTER — Other Ambulatory Visit: Payer: Self-pay | Admitting: Internal Medicine

## 2020-08-16 DIAGNOSIS — Z1231 Encounter for screening mammogram for malignant neoplasm of breast: Secondary | ICD-10-CM

## 2020-09-18 ENCOUNTER — Other Ambulatory Visit: Payer: Self-pay

## 2020-09-18 ENCOUNTER — Ambulatory Visit
Admission: RE | Admit: 2020-09-18 | Discharge: 2020-09-18 | Disposition: A | Discharge status: Home / Self Care | Payer: Medicare Other | Source: Ambulatory Visit | Attending: Internal Medicine | Admitting: Internal Medicine

## 2020-09-18 DIAGNOSIS — Z1231 Encounter for screening mammogram for malignant neoplasm of breast: Secondary | ICD-10-CM

## 2021-08-13 ENCOUNTER — Other Ambulatory Visit: Payer: Self-pay | Admitting: Internal Medicine

## 2021-08-13 DIAGNOSIS — Z1231 Encounter for screening mammogram for malignant neoplasm of breast: Secondary | ICD-10-CM

## 2021-09-23 ENCOUNTER — Other Ambulatory Visit: Payer: Self-pay

## 2021-09-23 ENCOUNTER — Ambulatory Visit
Admission: RE | Admit: 2021-09-23 | Discharge: 2021-09-23 | Disposition: A | Discharge status: Home / Self Care | Payer: Medicare Other | Source: Ambulatory Visit | Attending: Internal Medicine | Admitting: Internal Medicine

## 2021-09-23 DIAGNOSIS — Z1231 Encounter for screening mammogram for malignant neoplasm of breast: Secondary | ICD-10-CM

## 2022-11-12 ENCOUNTER — Other Ambulatory Visit: Payer: Self-pay | Admitting: Internal Medicine

## 2022-11-12 DIAGNOSIS — Z1231 Encounter for screening mammogram for malignant neoplasm of breast: Secondary | ICD-10-CM

## 2022-12-26 ENCOUNTER — Ambulatory Visit
Admission: RE | Admit: 2022-12-26 | Discharge: 2022-12-26 | Disposition: A | Discharge status: Home / Self Care | Payer: Medicare Other | Source: Ambulatory Visit | Attending: Internal Medicine | Admitting: Internal Medicine

## 2022-12-26 DIAGNOSIS — Z1231 Encounter for screening mammogram for malignant neoplasm of breast: Secondary | ICD-10-CM

## 2023-01-02 ENCOUNTER — Ambulatory Visit: Payer: Medicare Other
